# Patient Record
Sex: Male | Born: 1950 | Race: White | Hispanic: No | Marital: Married | Smoking: Never smoker
Health system: Southern US, Community
[De-identification: ages and names within clinical notes are randomized; demographics above are authoritative.]

## PROBLEM LIST (undated history)

## (undated) DIAGNOSIS — H269 Unspecified cataract: Secondary | ICD-10-CM

## (undated) DIAGNOSIS — M1990 Unspecified osteoarthritis, unspecified site: Secondary | ICD-10-CM

## (undated) DIAGNOSIS — K219 Gastro-esophageal reflux disease without esophagitis: Secondary | ICD-10-CM

## (undated) DIAGNOSIS — D869 Sarcoidosis, unspecified: Secondary | ICD-10-CM

## (undated) DIAGNOSIS — E785 Hyperlipidemia, unspecified: Secondary | ICD-10-CM

## (undated) DIAGNOSIS — T7840XA Allergy, unspecified, initial encounter: Secondary | ICD-10-CM

## (undated) HISTORY — DX: Unspecified osteoarthritis, unspecified site: M19.90

## (undated) HISTORY — PX: COLONOSCOPY: SHX174

## (undated) HISTORY — PX: POLYPECTOMY: SHX149

## (undated) HISTORY — PX: UPPER GASTROINTESTINAL ENDOSCOPY: SHX188

## (undated) HISTORY — PX: CHOLECYSTECTOMY: SHX55

## (undated) HISTORY — DX: Gastro-esophageal reflux disease without esophagitis: K21.9

## (undated) HISTORY — PX: APPENDECTOMY: SHX54

## (undated) HISTORY — DX: Hyperlipidemia, unspecified: E78.5

## (undated) HISTORY — DX: Sarcoidosis, unspecified: D86.9

## (undated) HISTORY — DX: Unspecified cataract: H26.9

## (undated) HISTORY — DX: Allergy, unspecified, initial encounter: T78.40XA

---

## 2010-02-08 ENCOUNTER — Encounter (INDEPENDENT_AMBULATORY_CARE_PROVIDER_SITE_OTHER): Payer: Self-pay | Admitting: *Deleted

## 2010-03-08 ENCOUNTER — Encounter (INDEPENDENT_AMBULATORY_CARE_PROVIDER_SITE_OTHER): Payer: Self-pay | Admitting: *Deleted

## 2010-03-11 ENCOUNTER — Ambulatory Visit: Payer: Self-pay | Admitting: Gastroenterology

## 2010-03-22 ENCOUNTER — Telehealth: Payer: Self-pay | Admitting: Gastroenterology

## 2010-04-17 ENCOUNTER — Ambulatory Visit: Payer: Self-pay | Admitting: Gastroenterology

## 2010-04-19 ENCOUNTER — Encounter: Payer: Self-pay | Admitting: Gastroenterology

## 2010-04-22 ENCOUNTER — Inpatient Hospital Stay (HOSPITAL_COMMUNITY): Admission: EM | Admit: 2010-04-22 | Discharge: 2010-05-02 | Payer: Self-pay | Admitting: Emergency Medicine

## 2010-04-24 ENCOUNTER — Encounter (INDEPENDENT_AMBULATORY_CARE_PROVIDER_SITE_OTHER): Payer: Self-pay | Admitting: General Surgery

## 2010-07-30 NOTE — Miscellaneous (Signed)
Summary: [facility] Previsit/prep  Clinical Lists Changes  Medications: Added new medication of MOVIPREP 100 GM  SOLR (PEG-KCL-NACL-NASULF-NA ASC-C) As per prep instructions. - Signed Rx of MOVIPREP 100 GM  SOLR (PEG-KCL-NACL-NASULF-NA ASC-C) As per prep instructions.;  #1 x 0;  Signed;  Entered by: Wyona Almas RN;  Authorized by: Mardella Layman MD [employer];  Method used: Electronically to [facility] [address] [place], [place], [place]  [address], Ph: [phone], Fax: [phone] Observations: Added new observation of NKA: T (03/11/2010 13:50)    Prescriptions: MOVIPREP 100 GM  SOLR (PEG-KCL-NACL-NASULF-NA ASC-C) As per prep instructions.  #1 x 0   Entered by:   Wyona Almas RN   Authorized by:   Mardella Layman MD [facility]   Signed by:   Wyona Almas RN on 03/11/2010   Method used:   Electronically to        [facility] (retail)       [address]       [place], [place]  [address]       Ph: [phone]       Fax: [phone]   RxID:   [id number]

## 2010-07-30 NOTE — Progress Notes (Signed)
Summary: cx fee?  Phone Note Call from Patient   Caller: Patient Call For: Dr. Jarold Motto Reason for Call: Talk to Doctor Summary of Call: pt called to cancel his COL scheduled for Monday... reason being his wife is having emergency surgery on Monday... pt rescheduled for next available (Oct 19th) Dr. Jarold Motto, do you wish to charge this pt a cx fee? Initial call taken by: Vallarie Mare,  March 22, 2010 9:11 AM  Follow-up for Phone Call         these calls are Adm. matters...we have protocols to follow..send these calls to [facility], Follow-up by: Mardella Layman MD FACG,  March 22, 2010 10:56 AM  Additional Follow-up for Phone Call Additional follow up Details #1::        Don't charge the late cancellation fee.  Additional Follow-up by: Clarnce Flock,  March 26, 2010 8:41 AM    Additional Follow-up for Phone Call Additional follow up Details #2::    Patient NOT BILLED. Follow-up by: Leanor Kail [facility],  March 27, 2010 11:56 AM

## 2010-07-30 NOTE — Letter (Signed)
Summary: Patient Notice- Polyp Results Pls delete-duplicate letter  [facility]  [address] [place], [place] [address]   Phone: [phone]  Fax: [phone]        April 19, 2010 MRN: [id number]    William [name] [address] [place], [place]  [address]    Dear Mr. Schueller,  I am pleased to inform you that the colon polyp(s) removed during your recent colonoscopy was (were) found to be benign (no cancer detected) upon pathologic examination.  I recommend you have a repeat colonoscopy examination in 5_ years to look for recurrent polyps, as having colon polyps increases your risk for having recurrent polyps or even colon cancer in the future.  Should you develop new or worsening symptoms of abdominal pain, bowel habit changes or bleeding from the rectum or bowels, please schedule an evaluation with either your primary care physician or with me.  Additional information/recommendations:  _xx_ No further action with gastroenterology is needed at this time. Please      follow-up with your primary care physician for your other healthcare      needs.  __ Please call [phone] to schedule a return visit to review your      situation.  __ Please keep your follow-up visit as already scheduled.  __ Continue treatment plan as outlined the day of your exam.  Please call us if you are having persistent problems or have questions about your condition that have not been fully answered at this time.  Sincerely,  Mardella Layman MD [facility]  This letter has been electronically signed by your physician.

## 2010-07-30 NOTE — Letter (Signed)
Summary: Patient Notice- Polyp Results  [facility]  [address] [place], [place] [address]   Phone: [phone]  Fax: [phone]        April 19, 2010 MRN: [id number]    William [name] [address] [place], [place]  [address]    Dear William [name],  I am pleased to inform you that the colon polyp(s) removed during your recent colonoscopy was (were) found to be benign (no cancer detected) upon pathologic examination.  I recommend you have a repeat colonoscopy examination in 5_ years to look for recurrent polyps, as having colon polyps increases your risk for having recurrent polyps or even colon cancer in the future.  Should you develop new or worsening symptoms of abdominal pain, bowel habit changes or bleeding from the rectum or bowels, please schedule an evaluation with either your primary care physician or with me.  Additional information/recommendations:  _x_ No further action with gastroenterology is needed at this time. Please      follow-up with your primary care physician for your other healthcare      needs.  __ Please call [phone] to schedule a return visit to review your      situation.  __ Please keep your follow-up visit as already scheduled.  __ Continue treatment plan as outlined the day of your exam.  Please call us if you are having persistent problems or have questions about your condition that have not been fully answered at this time.  Sincerely,  Mardella Layman MD [facility]  This letter has been electronically signed by your physician.  Appended Document: Patient Notice- Polyp Results letter mailed

## 2010-07-30 NOTE — Procedures (Signed)
Summary: Colonoscopy  Patient: William [name] Note: All result statuses are Final unless otherwise noted.  Tests: (1) Colonoscopy (COL)   COL Colonoscopy           DONE (C)     [facility]     [address]     [place], [place]  [address]           COLONOSCOPY PROCEDURE REPORT           PATIENT:  William [name], William [name]  MR#:  [id number]     BIRTHDATE:  [birth year], 59 yrs. old  GENDER:  male     ENDOSCOPIST:  Vania Rea. Jarold Motto, MD, [facility]     REF. BY:  Rudi Heap, M.D.     PROCEDURE DATE:  04/17/2010     PROCEDURE:  Colonoscopy with snare polypectomy     ASA CLASS:  Class II     INDICATIONS:  Routine Risk Screening     MEDICATIONS:   Fentanyl 50 mcg IV, Versed 6 mg IV           DESCRIPTION OF PROCEDURE:   After the risks benefits and     alternatives of the procedure were thoroughly explained, informed     consent was obtained.  Digital rectal exam was performed and     revealed no abnormalities.   The LB 180AL [id number] endoscope was     introduced through the anus and advanced to the cecum, which was     identified by both the appendix and ileocecal valve, without     limitations.  The quality of the prep was excellent, using     MoviPrep.  The instrument was then slowly withdrawn as the colon     was fully examined.     <<PROCEDUREIMAGES>>           FINDINGS:  ENDOSCOPIC FINDINGS:  This was otherwise a normal     examination of the colon. in the sigmoid colon. 1.5 CM STALKED     POLYP HOT SNARE EXCISED. A pedunculated polyp was found.     Retroflexed views in the rectum revealed no abnormalities.    The     scope was then withdrawn from the patient and the procedure     completed.           COMPLICATIONS:  None     ENDOSCOPIC IMPRESSION:     1) Otherwise normal examination     2) Descending colon polyp     RECOMMENDATIONS:     1) Repeat Colonoscopy in 5 years.     REPEAT EXAM:  No           ______________________________     Vania Rea. Jarold Motto, MD, [facility]        CC:           n.     REVISED:  04/17/2010 02:45 PM     eSIGNED:   Vania Rea. Patterson at 04/17/2010 02:45 PM           Darlina Guys, [id number]  Note: An exclamation mark (!) indicates a result that was not dispersed into the flowsheet. Document Creation Date: 04/17/2010 2:46 PM _______________________________________________________________________  (1) Order result status: Final Collection or observation date-time: 04/17/2010 14:30 Requested date-time:  Receipt date-time:  Reported date-time:  Referring Physician:   Ordering Physician: Sheryn Bison [phone]) Specimen Source:  Source: Launa Grill Order Number: [id number] Lab site:   Appended Document: Colonoscopy 5y f/u  Appended Document: Colonoscopy  Procedures Next Due Date:    Colonoscopy: 03/2015

## 2010-07-30 NOTE — Letter (Signed)
Summary: Previsit letter  [facility]  [address] [place], [place] [address]   Phone: [phone]  Fax: [phone]       02/08/2010 MRN: [id number]  William [name] [address] [place], [place]  [address]  Dear [name],  Welcome to the Gastroenterology Division at [facility].    You are scheduled to see a nurse for your pre-procedure visit on 03-11-10 at 2:00P.M. on the 3rd floor at [facility], [address]  We ask that you try to arrive at our office 15 minutes prior to your appointment time to allow for check-in.  Your nurse visit will consist of discussing your medical and surgical history, your immediate family medical history, and your medications.    Please bring a complete list of all your medications or, if you prefer, bring the medication bottles and we will list them.  We will need to be aware of both prescribed and over the counter drugs.  We will need to know exact dosage information as well.  If you are on blood thinners (Coumadin, Plavix, Aggrenox, Ticlid, etc.) please call our office today/prior to your appointment, as we need to consult with your physician about holding your medication.   Please be prepared to read and sign documents such as consent forms, a financial agreement, and acknowledgement forms.  If necessary, and with your consent, a friend or relative is welcome to sit-in on the nurse visit with you.  Please bring your insurance card so that we may make a copy of it.  If your insurance requires a referral to see a specialist, please bring your referral form from your primary care physician.  No co-pay is required for this nurse visit.     If you cannot keep your appointment, please call [phone] to cancel or reschedule prior to your appointment date.  This allows [place] the opportunity to schedule an appointment for another patient in need of care.    Thank you for choosing [facility] for your medical needs.   We appreciate the opportunity to care for you.  Please visit [place] at our website  to learn more about our practice.                     Sincerely.                                                                                                                   The Gastroenterology Division

## 2010-07-30 NOTE — Letter (Signed)
Summary: [facility] Instructions  [facility]  [address] [place], [place] [address]   Phone: [phone]  Fax: [phone]       ILYAS [name]    December 29, 1950    MRN: [id number]        Procedure Day Dorna Bloom: William [name]    03/25/2010     Arrival Time: 9:30AM     Procedure Time: 10:00AM     Location of Procedure:                    _X_  [facility] (4th Floor)                       PREPARATION FOR COLONOSCOPY WITH MOVIPREP   Starting 5 days prior to your procedure 03/20/2010 do not eat nuts, seeds, popcorn, corn, beans, peas,  salads, or any raw vegetables.  Do not take any fiber supplements (e.g. Metamucil, Citrucel, and Benefiber).  THE DAY BEFORE YOUR PROCEDURE         DATE: 03/24/10     DAY: SUNDAY  1.  Drink clear liquids the entire day-NO SOLID FOOD  2.  Do not drink anything colored red or purple.  Avoid juices with pulp.  No orange juice.  3.  Drink at least 64 oz. (8 glasses) of fluid/clear liquids during the day to prevent dehydration and help the prep work efficiently.  CLEAR LIQUIDS INCLUDE: Water Jello Ice Popsicles Tea (sugar ok, no milk/cream) Powdered fruit flavored drinks Coffee (sugar ok, no milk/cream) Gatorade Juice: apple, white grape, white cranberry  Lemonade Clear bullion, consomm, broth Carbonated beverages (any kind) Strained chicken noodle soup Hard Candy                             4.  In the morning, mix first dose of MoviPrep solution:    Empty 1 Pouch A and 1 Pouch B into the disposable container    Add lukewarm drinking water to the top line of the container. Mix to dissolve    Refrigerate (mixed solution should be used within 24 hrs)  5.  Begin drinking the prep at 5:00 p.m. The MoviPrep container is divided by 4 marks.   Every 15 minutes drink the solution down to the next mark (approximately 8 oz) until the full liter is complete.   6.  Follow completed prep with 16 oz of clear liquid of your choice  (Nothing red or purple).  Continue to drink clear liquids until bedtime.  7.  Before going to bed, mix second dose of MoviPrep solution:    Empty 1 Pouch A and 1 Pouch B into the disposable container    Add lukewarm drinking water to the top line of the container. Mix to dissolve    Refrigerate  THE DAY OF YOUR PROCEDURE      DATE: 03/25/10   DAY: MONDAY  Beginning at 5:30AM (5 hours before procedure):         1. Every 15 minutes, drink the solution down to the next mark (approx 8 oz) until the full liter is complete.  2. Follow completed prep with 16 oz. of clear liquid of your choice.    3. You may drink clear liquids until 8:30AM (2 HOURS BEFORE PROCEDURE).   MEDICATION INSTRUCTIONS  Unless otherwise instructed, you should take regular prescription medications with a small sip of water   as early as possible the  morning of your procedure.         OTHER INSTRUCTIONS  You will need a responsible adult at least 58 years of age to accompany you and drive you home.   This person must remain in the waiting room during your procedure.  Wear loose fitting clothing that is easily removed.  Leave jewelry and other valuables at home.  However, you may wish to bring a book to read or  an iPod/MP3 player to listen to music as you wait for your procedure to start.  Remove all body piercing jewelry and leave at home.  Total time from sign-in until discharge is approximately 2-3 hours.  You should go home directly after your procedure and rest.  You can resume normal activities the  day after your procedure.  The day of your procedure you should not:   Drive   Make legal decisions   Operate machinery   Drink alcohol   Return to work  You will receive specific instructions about eating, activities and medications before you leave.    The above instructions have been reviewed and explained to me by  Wyona Almas RN  March 11, 2010 2:18 PM     I fully  understand and can verbalize these instructions _____________________________ Date _________   Appended Document: Moviprep Instructions Procedure time is 10:30AM

## 2010-09-10 LAB — CBC
HCT: 37.6 % — ABNORMAL LOW (ref 39.0–52.0)
Hemoglobin: 12.9 g/dL — ABNORMAL LOW (ref 13.0–17.0)
MCHC: 34.1 g/dL (ref 30.0–36.0)
MCHC: 34.2 g/dL (ref 30.0–36.0)
MCHC: 34.2 g/dL (ref 30.0–36.0)
Platelets: 272 10*3/uL (ref 150–400)
RDW: 13.1 % (ref 11.5–15.5)
RDW: 13.8 % (ref 11.5–15.5)

## 2010-09-10 LAB — MAGNESIUM: Magnesium: 2 mg/dL (ref 1.5–2.5)

## 2010-09-10 LAB — COMPREHENSIVE METABOLIC PANEL
ALT: 47 U/L (ref 0–53)
ALT: 48 U/L (ref 0–53)
ALT: 75 U/L — ABNORMAL HIGH (ref 0–53)
AST: 31 U/L (ref 0–37)
AST: 69 U/L — ABNORMAL HIGH (ref 0–37)
Albumin: 2.3 g/dL — ABNORMAL LOW (ref 3.5–5.2)
Alkaline Phosphatase: 98 U/L (ref 39–117)
CO2: 24 mEq/L (ref 19–32)
Calcium: 7.6 mg/dL — ABNORMAL LOW (ref 8.4–10.5)
Calcium: 7.7 mg/dL — ABNORMAL LOW (ref 8.4–10.5)
Calcium: 8.1 mg/dL — ABNORMAL LOW (ref 8.4–10.5)
Creatinine, Ser: 0.8 mg/dL (ref 0.4–1.5)
GFR calc Af Amer: >60 mL/min (ref >60)
GFR calc Af Amer: >60 mL/min (ref >60)
GFR calc non Af Amer: >60 mL/min (ref >60)
Glucose, Bld: 91 mg/dL (ref 70–99)
Glucose, Bld: 93 mg/dL (ref 70–99)
Glucose, Bld: 97 mg/dL (ref 70–99)
Potassium: 3.6 mEq/L (ref 3.5–5.1)
Sodium: 137 mEq/L (ref 135–145)
Sodium: 140 mEq/L (ref 135–145)
Sodium: 141 mEq/L (ref 135–145)
Total Bilirubin: 1.3 mg/dL — ABNORMAL HIGH (ref 0.3–1.2)
Total Protein: 6 g/dL (ref 6.0–8.3)
Total Protein: 6.7 g/dL (ref 6.0–8.3)

## 2010-09-10 LAB — DIFFERENTIAL
Basophils Absolute: 0 10*3/uL (ref 0.0–0.1)
Basophils Relative: 0 % (ref 0–1)
Eosinophils Absolute: 0.1 10*3/uL (ref 0.0–0.7)
Eosinophils Absolute: 0.1 10*3/uL (ref 0.0–0.7)
Eosinophils Absolute: 0.1 10*3/uL (ref 0.0–0.7)
Lymphocytes Relative: 4 % — ABNORMAL LOW (ref 12–46)
Lymphs Abs: 0.3 10*3/uL — ABNORMAL LOW (ref 0.7–4.0)
Lymphs Abs: 0.5 10*3/uL — ABNORMAL LOW (ref 0.7–4.0)
Monocytes Relative: 6 % (ref 3–12)
Monocytes Relative: 7 % (ref 3–12)
Neutrophils Relative %: 89 % — ABNORMAL HIGH (ref 43–77)
Neutrophils Relative %: 89 % — ABNORMAL HIGH (ref 43–77)
Neutrophils Relative %: 89 % — ABNORMAL HIGH (ref 43–77)

## 2010-09-10 LAB — PHOSPHORUS
Phosphorus: 2.9 mg/dL (ref 2.3–4.6)
Phosphorus: 3.1 mg/dL (ref 2.3–4.6)
Phosphorus: 3.3 mg/dL (ref 2.3–4.6)

## 2010-09-11 LAB — BASIC METABOLIC PANEL
BUN: 14 mg/dL (ref 6–23)
BUN: 16 mg/dL (ref 6–23)
BUN: 18 mg/dL (ref 6–23)
BUN: 26 mg/dL — ABNORMAL HIGH (ref 6–23)
CO2: 28 mEq/L (ref 19–32)
Chloride: 100 mEq/L (ref 96–112)
Chloride: 99 mEq/L (ref 96–112)
Creatinine, Ser: 0.91 mg/dL (ref 0.4–1.5)
Creatinine, Ser: 0.94 mg/dL (ref 0.4–1.5)
GFR calc Af Amer: >60 mL/min (ref >60)
GFR calc non Af Amer: >60 mL/min (ref >60)
GFR calc non Af Amer: >60 mL/min (ref >60)
Glucose, Bld: 110 mg/dL — ABNORMAL HIGH (ref 70–99)
Glucose, Bld: 111 mg/dL — ABNORMAL HIGH (ref 70–99)
Potassium: 3.8 mEq/L (ref 3.5–5.1)
Potassium: 3.9 mEq/L (ref 3.5–5.1)
Potassium: 4.1 mEq/L (ref 3.5–5.1)
Sodium: 134 mEq/L — ABNORMAL LOW (ref 135–145)
Sodium: 136 mEq/L (ref 135–145)

## 2010-09-11 LAB — DIFFERENTIAL
Basophils Absolute: 0 10*3/uL (ref 0.0–0.1)
Basophils Absolute: 0 10*3/uL (ref 0.0–0.1)
Basophils Absolute: 0 10*3/uL (ref 0.0–0.1)
Basophils Relative: 0 % (ref 0–1)
Basophils Relative: 0 % (ref 0–1)
Basophils Relative: 0 % (ref 0–1)
Basophils Relative: 0 % (ref 0–1)
Basophils Relative: 0 % (ref 0–1)
Eosinophils Absolute: 0 10*3/uL (ref 0.0–0.7)
Eosinophils Absolute: 0 10*3/uL (ref 0.0–0.7)
Eosinophils Absolute: 0 10*3/uL (ref 0.0–0.7)
Eosinophils Absolute: 0.1 10*3/uL (ref 0.0–0.7)
Eosinophils Relative: 0 % (ref 0–5)
Eosinophils Relative: 0 % (ref 0–5)
Eosinophils Relative: 0 % (ref 0–5)
Lymphocytes Relative: 4 % — ABNORMAL LOW (ref 12–46)
Lymphocytes Relative: 4 % — ABNORMAL LOW (ref 12–46)
Lymphs Abs: 0.3 10*3/uL — ABNORMAL LOW (ref 0.7–4.0)
Lymphs Abs: 0.3 10*3/uL — ABNORMAL LOW (ref 0.7–4.0)
Lymphs Abs: 0.4 10*3/uL — ABNORMAL LOW (ref 0.7–4.0)
Lymphs Abs: 0.4 10*3/uL — ABNORMAL LOW (ref 0.7–4.0)
Monocytes Absolute: 0.5 10*3/uL (ref 0.1–1.0)
Monocytes Absolute: 0.6 10*3/uL (ref 0.1–1.0)
Monocytes Absolute: 0.7 10*3/uL (ref 0.1–1.0)
Monocytes Absolute: 0.7 10*3/uL (ref 0.1–1.0)
Monocytes Relative: 3 % (ref 3–12)
Monocytes Relative: 4 % (ref 3–12)
Monocytes Relative: 5 % (ref 3–12)
Neutro Abs: 13.6 10*3/uL — ABNORMAL HIGH (ref 1.7–7.7)
Neutro Abs: 16.6 10*3/uL — ABNORMAL HIGH (ref 1.7–7.7)
Neutro Abs: 6.5 10*3/uL (ref 1.7–7.7)
Neutro Abs: 6.5 10*3/uL (ref 1.7–7.7)
Neutro Abs: 9 10*3/uL — ABNORMAL HIGH (ref 1.7–7.7)
Neutro Abs: 9.9 10*3/uL — ABNORMAL HIGH (ref 1.7–7.7)
Neutrophils Relative %: 85 % — ABNORMAL HIGH (ref 43–77)
Neutrophils Relative %: 86 % — ABNORMAL HIGH (ref 43–77)
Neutrophils Relative %: 90 % — ABNORMAL HIGH (ref 43–77)
Neutrophils Relative %: 93 % — ABNORMAL HIGH (ref 43–77)
Neutrophils Relative %: 93 % — ABNORMAL HIGH (ref 43–77)

## 2010-09-11 LAB — COMPREHENSIVE METABOLIC PANEL
ALT: 53 U/L (ref 0–53)
ALT: 62 U/L — ABNORMAL HIGH (ref 0–53)
AST: 62 U/L — ABNORMAL HIGH (ref 0–37)
Albumin: 2.2 g/dL — ABNORMAL LOW (ref 3.5–5.2)
Albumin: 2.4 g/dL — ABNORMAL LOW (ref 3.5–5.2)
Albumin: 3.3 g/dL — ABNORMAL LOW (ref 3.5–5.2)
Alkaline Phosphatase: 63 U/L (ref 39–117)
Alkaline Phosphatase: 78 U/L (ref 39–117)
Alkaline Phosphatase: 81 U/L (ref 39–117)
Alkaline Phosphatase: 97 U/L (ref 39–117)
BUN: 20 mg/dL (ref 6–23)
BUN: 20 mg/dL (ref 6–23)
BUN: 22 mg/dL (ref 6–23)
CO2: 27 mEq/L (ref 19–32)
CO2: 29 mEq/L (ref 19–32)
Calcium: 7.7 mg/dL — ABNORMAL LOW (ref 8.4–10.5)
Calcium: 7.8 mg/dL — ABNORMAL LOW (ref 8.4–10.5)
Calcium: 8.3 mg/dL — ABNORMAL LOW (ref 8.4–10.5)
Chloride: 99 mEq/L (ref 96–112)
Creatinine, Ser: 0.86 mg/dL (ref 0.4–1.5)
GFR calc non Af Amer: >60 mL/min (ref >60)
Glucose, Bld: 108 mg/dL — ABNORMAL HIGH (ref 70–99)
Glucose, Bld: 125 mg/dL — ABNORMAL HIGH (ref 70–99)
Glucose, Bld: 126 mg/dL — ABNORMAL HIGH (ref 70–99)
Potassium: 3.7 mEq/L (ref 3.5–5.1)
Potassium: 4.3 mEq/L (ref 3.5–5.1)
Sodium: 135 mEq/L (ref 135–145)
Sodium: 140 mEq/L (ref 135–145)
Total Protein: 5.5 g/dL — ABNORMAL LOW (ref 6.0–8.3)
Total Protein: 5.9 g/dL — ABNORMAL LOW (ref 6.0–8.3)
Total Protein: 7 g/dL (ref 6.0–8.3)

## 2010-09-11 LAB — CBC
HCT: 35.1 % — ABNORMAL LOW (ref 39.0–52.0)
HCT: 37.1 % — ABNORMAL LOW (ref 39.0–52.0)
HCT: 39.3 % (ref 39.0–52.0)
HCT: 44.1 % (ref 39.0–52.0)
Hemoglobin: 12 g/dL — ABNORMAL LOW (ref 13.0–17.0)
Hemoglobin: 13.4 g/dL (ref 13.0–17.0)
Hemoglobin: 13.4 g/dL (ref 13.0–17.0)
Hemoglobin: 15.1 g/dL (ref 13.0–17.0)
MCH: 30.6 pg (ref 26.0–34.0)
MCH: 30.6 pg (ref 26.0–34.0)
MCH: 30.8 pg (ref 26.0–34.0)
MCHC: 34.2 g/dL (ref 30.0–36.0)
MCHC: 34.2 g/dL (ref 30.0–36.0)
MCHC: 34.4 g/dL (ref 30.0–36.0)
MCHC: 34.9 g/dL (ref 30.0–36.0)
MCV: 88.9 fL (ref 78.0–100.0)
MCV: 89.6 fL (ref 78.0–100.0)
MCV: 89.7 fL (ref 78.0–100.0)
MCV: 90.1 fL (ref 78.0–100.0)
MCV: 90.4 fL (ref 78.0–100.0)
MCV: 90.8 fL (ref 78.0–100.0)
Platelets: 141 10*3/uL — ABNORMAL LOW (ref 150–400)
Platelets: 145 10*3/uL — ABNORMAL LOW (ref 150–400)
Platelets: 205 10*3/uL (ref 150–400)
Platelets: 210 10*3/uL (ref 150–400)
RBC: 4.3 MIL/uL (ref 4.22–5.81)
RBC: 4.37 MIL/uL (ref 4.22–5.81)
RBC: 4.71 MIL/uL (ref 4.22–5.81)
RBC: 4.96 MIL/uL (ref 4.22–5.81)
RDW: 12.9 % (ref 11.5–15.5)
RDW: 13.3 % (ref 11.5–15.5)
RDW: 13.5 % (ref 11.5–15.5)
RDW: 13.6 % (ref 11.5–15.5)
RDW: 13.7 % (ref 11.5–15.5)
WBC: 10 10*3/uL (ref 4.0–10.5)
WBC: 17.8 10*3/uL — ABNORMAL HIGH (ref 4.0–10.5)
WBC: 17.9 10*3/uL — ABNORMAL HIGH (ref 4.0–10.5)
WBC: 7.6 10*3/uL (ref 4.0–10.5)

## 2010-09-11 LAB — HEPATIC FUNCTION PANEL
ALT: 17 U/L (ref 0–53)
AST: 29 U/L (ref 0–37)
Albumin: 3.9 g/dL (ref 3.5–5.2)
Alkaline Phosphatase: 76 U/L (ref 39–117)
Total Protein: 7.8 g/dL (ref 6.0–8.3)

## 2010-09-11 LAB — AMYLASE: Amylase: 38 U/L (ref 0–105)

## 2010-09-11 LAB — URINALYSIS, ROUTINE W REFLEX MICROSCOPIC
Bilirubin Urine: NEGATIVE
Glucose, UA: NEGATIVE mg/dL
Hgb urine dipstick: NEGATIVE
Specific Gravity, Urine: >1.03 — ABNORMAL HIGH (ref 1.005–1.030)
pH: 6 (ref 5.0–8.0)

## 2010-09-11 LAB — MAGNESIUM
Magnesium: 2.1 mg/dL (ref 1.5–2.5)
Magnesium: 2.2 mg/dL (ref 1.5–2.5)
Magnesium: 2.2 mg/dL (ref 1.5–2.5)

## 2010-09-11 LAB — PHOSPHORUS
Phosphorus: 2.5 mg/dL (ref 2.3–4.6)
Phosphorus: 2.9 mg/dL (ref 2.3–4.6)
Phosphorus: 3.1 mg/dL (ref 2.3–4.6)
Phosphorus: 4.3 mg/dL (ref 2.3–4.6)

## 2011-09-12 ENCOUNTER — Ambulatory Visit (INDEPENDENT_AMBULATORY_CARE_PROVIDER_SITE_OTHER): Payer: BC Managed Care – PPO | Admitting: Urology

## 2011-09-12 DIAGNOSIS — N138 Other obstructive and reflux uropathy: Secondary | ICD-10-CM

## 2011-09-12 DIAGNOSIS — N401 Enlarged prostate with lower urinary tract symptoms: Secondary | ICD-10-CM

## 2011-09-12 DIAGNOSIS — R972 Elevated prostate specific antigen [PSA]: Secondary | ICD-10-CM

## 2012-09-24 ENCOUNTER — Ambulatory Visit (INDEPENDENT_AMBULATORY_CARE_PROVIDER_SITE_OTHER): Payer: BC Managed Care – PPO | Admitting: Urology

## 2012-09-24 DIAGNOSIS — N138 Other obstructive and reflux uropathy: Secondary | ICD-10-CM

## 2012-09-24 DIAGNOSIS — N529 Male erectile dysfunction, unspecified: Secondary | ICD-10-CM

## 2012-09-24 DIAGNOSIS — N401 Enlarged prostate with lower urinary tract symptoms: Secondary | ICD-10-CM

## 2012-09-24 DIAGNOSIS — R972 Elevated prostate specific antigen [PSA]: Secondary | ICD-10-CM

## 2012-12-10 ENCOUNTER — Other Ambulatory Visit: Payer: Self-pay | Admitting: Urology

## 2012-12-10 DIAGNOSIS — R972 Elevated prostate specific antigen [PSA]: Secondary | ICD-10-CM

## 2012-12-20 ENCOUNTER — Ambulatory Visit (HOSPITAL_COMMUNITY)
Admission: RE | Admit: 2012-12-20 | Discharge: 2012-12-20 | Disposition: A | Discharge status: Home / Self Care | Payer: BC Managed Care – PPO | Source: Ambulatory Visit | Attending: Urology | Admitting: Urology

## 2012-12-20 DIAGNOSIS — R972 Elevated prostate specific antigen [PSA]: Secondary | ICD-10-CM

## 2013-01-20 ENCOUNTER — Ambulatory Visit (HOSPITAL_COMMUNITY)
Admission: RE | Admit: 2013-01-20 | Discharge: 2013-01-20 | Disposition: A | Discharge status: Home / Self Care | Payer: BC Managed Care – PPO | Source: Ambulatory Visit | Attending: Urology | Admitting: Urology

## 2013-01-20 DIAGNOSIS — N400 Enlarged prostate without lower urinary tract symptoms: Secondary | ICD-10-CM | POA: Insufficient documentation

## 2013-01-20 DIAGNOSIS — N429 Disorder of prostate, unspecified: Secondary | ICD-10-CM | POA: Insufficient documentation

## 2013-01-20 DIAGNOSIS — R972 Elevated prostate specific antigen [PSA]: Secondary | ICD-10-CM | POA: Insufficient documentation

## 2013-01-20 MED ORDER — GADOBENATE DIMEGLUMINE 529 MG/ML IV SOLN
20.0000 mL | Freq: Once | INTRAVENOUS | Status: AC | PRN
  Administered 2013-01-20: 16 mL via INTRAVENOUS

## 2013-07-26 ENCOUNTER — Ambulatory Visit (INDEPENDENT_AMBULATORY_CARE_PROVIDER_SITE_OTHER): Payer: BC Managed Care – PPO

## 2013-07-26 ENCOUNTER — Ambulatory Visit (INDEPENDENT_AMBULATORY_CARE_PROVIDER_SITE_OTHER): Payer: BC Managed Care – PPO | Admitting: General Practice

## 2013-07-26 ENCOUNTER — Encounter: Payer: Self-pay | Admitting: General Practice

## 2013-07-26 VITALS — BP 114/73 | HR 62 | Temp 97.7°F | Ht 69.0 in | Wt 177.0 lb

## 2013-07-26 DIAGNOSIS — J069 Acute upper respiratory infection, unspecified: Secondary | ICD-10-CM

## 2013-07-26 DIAGNOSIS — R05 Cough: Secondary | ICD-10-CM

## 2013-07-26 DIAGNOSIS — R059 Cough, unspecified: Secondary | ICD-10-CM

## 2013-07-26 MED ORDER — BENZONATATE 100 MG PO CAPS: 100.0000 mg | ORAL_CAPSULE | Freq: Three times a day (TID) | ORAL | Status: DC | PRN

## 2013-07-26 MED ORDER — AZITHROMYCIN 250 MG PO TABS: ORAL_TABLET | ORAL | Status: DC

## 2013-07-26 NOTE — Patient Instructions (Signed)

## 2013-07-26 NOTE — Progress Notes (Signed)
   Subjective:    Patient ID: William [name], male    DOB: 03-Aug-1950, 63 y.o.   MRN: [id number]  Cough This is a new problem. The current episode started in the past 7 days. The problem has been unchanged. The cough is productive of sputum. Associated symptoms include postnasal drip. Pertinent negatives include no chest pain, chills, fever, headaches, myalgias, nasal congestion, sore throat, shortness of breath or wheezing. The symptoms are aggravated by lying down. He has tried OTC cough suppressant for the symptoms. The treatment provided mild relief. His past medical history is significant for bronchitis. There is no history of asthma or pneumonia.      Review of Systems  Constitutional: Negative for fever and chills.  HENT: Positive for postnasal drip. Negative for sore throat.   Respiratory: Positive for cough and chest tightness. Negative for shortness of breath and wheezing.   Cardiovascular: Negative for chest pain and palpitations.  Musculoskeletal: Negative for myalgias.  Neurological: Negative for dizziness, weakness and headaches.       Objective:   Physical Exam  Constitutional: He is oriented to person, place, and time. He appears well-developed and well-nourished.  HENT:  Head: Normocephalic and atraumatic.  Right Ear: External ear normal.  Left Ear: External ear normal.  Mouth/Throat: Oropharynx is clear and moist.  Cardiovascular: Normal rate, regular rhythm and normal heart sounds.   Pulmonary/Chest: Effort normal and breath sounds normal. No respiratory distress. He exhibits no tenderness.  Neurological: He is alert and oriented to person, place, and time.  Skin: Skin is warm and dry.     [facility] reading (PRIMARY) by Erby Pian, FNP-C, acute changes noted.        Assessment & Plan:  1. Cough  - DG Chest 2 View; Future - benzonatate (TESSALON) 100 MG capsule; Take 1 capsule (100 mg total) by mouth 3 (three) times daily as needed.  Dispense: 30  capsule; Refill: 0 - DG Chest 2 View; Future - CBC With differential/Platelet  2. Upper respiratory infection  - azithromycin (ZITHROMAX) 250 MG tablet; Take as directed  Dispense: 6 tablet; Refill: 0 - CBC With differential/Platelet -RTO if symptoms worsen, may seek emergency medical treatment -follow up with chest xray in 1 month Patient verbalized understanding Erby Pian, FNP-C

## 2013-07-28 LAB — CBC WITH DIFFERENTIAL
BASOS: 0 %
Basophils Absolute: 0 10*3/uL (ref 0.0–0.2)
EOS: 2 %
Eosinophils Absolute: 0.1 10*3/uL (ref 0.0–0.4)
HCT: 44 % (ref 37.5–51.0)
HEMOGLOBIN: 15 g/dL (ref 12.6–17.7)
IMMATURE GRANS (ABS): 0 10*3/uL (ref 0.0–0.1)
IMMATURE GRANULOCYTES: 0 %
LYMPHS: 12 %
Lymphocytes Absolute: 0.5 10*3/uL — ABNORMAL LOW (ref 0.7–3.1)
MCH: 30.6 pg (ref 26.6–33.0)
MCHC: 34.1 g/dL (ref 31.5–35.7)
MCV: 90 fL (ref 79–97)
MONOCYTES: 12 %
Monocytes Absolute: 0.5 10*3/uL (ref 0.1–0.9)
NEUTROS PCT: 74 %
Neutrophils Absolute: 2.9 10*3/uL (ref 1.4–7.0)
PLATELETS: 229 10*3/uL (ref 150–379)
RBC: 4.9 x10E6/uL (ref 4.14–5.80)
RDW: 13.5 % (ref 12.3–15.4)
WBC: 3.9 10*3/uL (ref 3.4–10.8)

## 2013-08-11 ENCOUNTER — Telehealth: Payer: Self-pay | Admitting: General Practice

## 2013-08-11 NOTE — Telephone Encounter (Signed)
Spoke with wife. Appt moved to tomorrow.

## 2013-08-12 ENCOUNTER — Ambulatory Visit: Payer: BC Managed Care – PPO

## 2013-08-12 ENCOUNTER — Ambulatory Visit (INDEPENDENT_AMBULATORY_CARE_PROVIDER_SITE_OTHER): Payer: BC Managed Care – PPO | Admitting: General Practice

## 2013-08-12 ENCOUNTER — Encounter: Payer: Self-pay | Admitting: General Practice

## 2013-08-12 VITALS — BP 111/66 | HR 53 | Temp 96.8°F | Ht 69.0 in | Wt 178.5 lb

## 2013-08-12 DIAGNOSIS — R05 Cough: Secondary | ICD-10-CM

## 2013-08-12 DIAGNOSIS — R059 Cough, unspecified: Secondary | ICD-10-CM

## 2013-08-12 NOTE — Patient Instructions (Signed)
Allergic Rhinitis Allergic rhinitis is when the mucous membranes in the nose respond to allergens. Allergens are particles in the air that cause your body to have an allergic reaction. This causes you to release allergic antibodies. Through a chain of events, these eventually cause you to release histamine into the blood stream. Although meant to protect the body, it is this release of histamine that causes your discomfort, such as frequent sneezing, congestion, and an itchy, runny nose.  CAUSES  Seasonal allergic rhinitis (hay fever) is caused by pollen allergens that may come from grasses, trees, and weeds. Year-round allergic rhinitis (perennial allergic rhinitis) is caused by allergens such as house dust mites, pet dander, and mold spores.  SYMPTOMS   Nasal stuffiness (congestion).  Itchy, runny nose with sneezing and tearing of the eyes. DIAGNOSIS  Your health care provider can help you determine the allergen or allergens that trigger your symptoms. If you and your health care provider are unable to determine the allergen, skin or blood testing may be used. TREATMENT  Allergic Rhinitis does not have a cure, but it can be controlled by:  Medicines and allergy shots (immunotherapy).  Avoiding the allergen. Hay fever may often be treated with antihistamines in pill or nasal spray forms. Antihistamines block the effects of histamine. There are over-the-counter medicines that may help with nasal congestion and swelling around the eyes. Check with your health care provider before taking or giving this medicine.  If avoiding the allergen or the medicine prescribed do not work, there are many new medicines your health care provider can prescribe. Stronger medicine may be used if initial measures are ineffective. Desensitizing injections can be used if medicine and avoidance does not work. Desensitization is when a patient is given ongoing shots until the body becomes less sensitive to the allergen.  Make sure you follow up with your health care provider if problems continue. HOME CARE INSTRUCTIONS It is not possible to completely avoid allergens, but you can reduce your symptoms by taking steps to limit your exposure to them. It helps to know exactly what you are allergic to so that you can avoid your specific triggers. SEEK MEDICAL CARE IF:   You have a fever.  You develop a cough that does not stop easily (persistent).  You have shortness of breath.  You start wheezing.  Symptoms interfere with normal daily activities. Document Released: 03/11/2001 Document Revised: 04/06/2013 Document Reviewed: 02/21/2013 [employer] Patient Information 2014 [employer].  

## 2013-08-12 NOTE — Progress Notes (Signed)
   Subjective:    Patient ID: William [name], male    DOB: Oct 27, 1950, 63 y.o.   MRN: [id number]  HPI Patient presents today for follow up of cough. He was seen on 07/26/13 and reports improvement.     Review of Systems  Constitutional: Negative for fever and chills.  Respiratory: Negative for chest tightness and shortness of breath.   Cardiovascular: Negative for chest pain and palpitations.       Objective:   Physical Exam  Constitutional: He is oriented to person, place, and time. He appears well-developed and well-nourished.  Cardiovascular: Normal rate, regular rhythm and normal heart sounds.   Pulmonary/Chest: Effort normal and breath sounds normal. No respiratory distress. He exhibits no tenderness.  Neurological: He is alert and oriented to person, place, and time.        Assessment & Plan:  1. Cough - DG Chest 2 View; Future -RTO if symptoms worsen F/u chest xray in 2 weeks Patient verbalized understanding Erby Pian, FNP-C

## 2013-08-23 ENCOUNTER — Telehealth: Payer: Self-pay | Admitting: General Practice

## 2013-08-23 ENCOUNTER — Ambulatory Visit: Payer: BC Managed Care – PPO | Admitting: General Practice

## 2013-08-23 NOTE — Telephone Encounter (Signed)
APPT MADE

## 2013-08-24 ENCOUNTER — Ambulatory Visit (INDEPENDENT_AMBULATORY_CARE_PROVIDER_SITE_OTHER): Payer: BC Managed Care – PPO | Admitting: General Practice

## 2013-08-24 ENCOUNTER — Encounter: Payer: Self-pay | Admitting: General Practice

## 2013-08-24 ENCOUNTER — Ambulatory Visit (INDEPENDENT_AMBULATORY_CARE_PROVIDER_SITE_OTHER): Payer: BC Managed Care – PPO

## 2013-08-24 VITALS — BP 106/56 | HR 63 | Temp 97.0°F | Ht 69.0 in | Wt 177.0 lb

## 2013-08-24 DIAGNOSIS — R9389 Abnormal findings on diagnostic imaging of other specified body structures: Secondary | ICD-10-CM

## 2013-08-24 DIAGNOSIS — R059 Cough, unspecified: Secondary | ICD-10-CM

## 2013-08-24 DIAGNOSIS — R918 Other nonspecific abnormal finding of lung field: Secondary | ICD-10-CM

## 2013-08-24 DIAGNOSIS — J988 Other specified respiratory disorders: Secondary | ICD-10-CM

## 2013-08-24 DIAGNOSIS — R05 Cough: Secondary | ICD-10-CM

## 2013-08-24 MED ORDER — LEVOFLOXACIN 750 MG PO TABS: 750.0000 mg | ORAL_TABLET | Freq: Every day | ORAL | Status: DC

## 2013-08-24 NOTE — Patient Instructions (Signed)

## 2013-08-24 NOTE — Progress Notes (Signed)
Subjective:    Patient ID: William [name], male    DOB: [birth year], 63 y.o.   MRN: [id number]  Cough This is a recurrent problem. The current episode started 1 to 4 weeks ago. The problem has been unchanged. The cough is non-productive. Pertinent negatives include no chest pain, chills, fever, nasal congestion, postnasal drip, shortness of breath or wheezing. The symptoms are aggravated by cold air. He has tried OTC cough suppressant for the symptoms. The treatment provided moderate relief. There is no history of asthma, bronchitis or pneumonia.      Review of Systems  Constitutional: Negative for fever and chills.  HENT: Negative for postnasal drip.   Respiratory: Positive for cough. Negative for chest tightness, shortness of breath and wheezing.   Cardiovascular: Negative for chest pain and palpitations.  All other systems reviewed and are negative.       Objective:   Physical Exam  Constitutional: He is oriented to person, place, and time. He appears well-developed and well-nourished.  Cardiovascular: Normal rate, regular rhythm and normal heart sounds.   Pulmonary/Chest: Effort normal and breath sounds normal. No respiratory distress. He exhibits no tenderness.  Neurological: He is alert and oriented to person, place, and time.  Skin: Skin is warm and dry.  Psychiatric: He has a normal mood and affect.    [facility] reading (PRIMARY) by Erby Pian, FNP-C, no improvement from previous.       Assessment & Plan:  1. Cough  - DG Chest 2 View; Future  2. Respiratory infection  - levofloxacin (LEVAQUIN) 750 MG tablet; Take 1 tablet (750 mg total) by mouth daily.  Dispense: 7 tablet; Refill: 0 - CT Chest Wo Contrast; Future (Patient denies being able to have ct performed before Wednesday, Mar. 4, 2015) - CMP14+EGFR  3. Abnormal chest x-ray -maintain appointment for CT scan once scheduled -may seek emergency medical treatment -Patient verbalized understanding Erby Pian, FNP-C

## 2013-08-25 LAB — CMP14+EGFR
A/G RATIO: 1.4 (ref 1.1–2.5)
ALT: 14 IU/L (ref 0–44)
AST: 16 IU/L (ref 0–40)
Albumin: 4.3 g/dL (ref 3.6–4.8)
Alkaline Phosphatase: 74 IU/L (ref 39–117)
BUN/Creatinine Ratio: 22 (ref 10–22)
BUN: 20 mg/dL (ref 8–27)
CALCIUM: 9.4 mg/dL (ref 8.6–10.2)
CO2: 24 mmol/L (ref 18–29)
CREATININE: 0.92 mg/dL (ref 0.76–1.27)
Chloride: 103 mmol/L (ref 97–108)
GFR calc Af Amer: 103 mL/min/{1.73_m2} (ref >59)
GFR, EST NON AFRICAN AMERICAN: 89 mL/min/{1.73_m2} (ref >59)
GLOBULIN, TOTAL: 3 g/dL (ref 1.5–4.5)
GLUCOSE: 96 mg/dL (ref 65–99)
Potassium: 4.5 mmol/L (ref 3.5–5.2)
Sodium: 141 mmol/L (ref 134–144)
TOTAL PROTEIN: 7.3 g/dL (ref 6.0–8.5)
Total Bilirubin: 0.3 mg/dL (ref 0.0–1.2)

## 2013-08-26 ENCOUNTER — Other Ambulatory Visit: Payer: Self-pay | Admitting: Nurse Practitioner

## 2013-08-26 DIAGNOSIS — R9389 Abnormal findings on diagnostic imaging of other specified body structures: Secondary | ICD-10-CM

## 2013-08-28 DIAGNOSIS — D869 Sarcoidosis, unspecified: Secondary | ICD-10-CM

## 2013-08-28 HISTORY — DX: Sarcoidosis, unspecified: D86.9

## 2013-08-31 ENCOUNTER — Other Ambulatory Visit: Payer: Self-pay | Admitting: Nurse Practitioner

## 2013-08-31 ENCOUNTER — Ambulatory Visit (HOSPITAL_COMMUNITY)
Admission: RE | Admit: 2013-08-31 | Discharge: 2013-08-31 | Disposition: A | Discharge status: Home / Self Care | Payer: BC Managed Care – PPO | Source: Ambulatory Visit | Attending: Nurse Practitioner | Admitting: Nurse Practitioner

## 2013-08-31 DIAGNOSIS — R05 Cough: Secondary | ICD-10-CM | POA: Insufficient documentation

## 2013-08-31 DIAGNOSIS — R9389 Abnormal findings on diagnostic imaging of other specified body structures: Secondary | ICD-10-CM

## 2013-08-31 DIAGNOSIS — R059 Cough, unspecified: Secondary | ICD-10-CM | POA: Insufficient documentation

## 2013-09-01 ENCOUNTER — Telehealth: Payer: Self-pay | Admitting: General Practice

## 2013-09-02 ENCOUNTER — Other Ambulatory Visit: Payer: Self-pay | Admitting: General Practice

## 2013-09-02 DIAGNOSIS — R9389 Abnormal findings on diagnostic imaging of other specified body structures: Secondary | ICD-10-CM

## 2013-09-02 NOTE — Telephone Encounter (Signed)
Spoke with patient and informed of CT results. Discussed pulmonology referral and patient in agreement. Pulmonology referral made.

## 2013-09-02 NOTE — Telephone Encounter (Signed)
Please advise 

## 2013-09-05 ENCOUNTER — Ambulatory Visit (INDEPENDENT_AMBULATORY_CARE_PROVIDER_SITE_OTHER): Payer: BC Managed Care – PPO | Admitting: Pulmonary Disease

## 2013-09-05 ENCOUNTER — Encounter: Payer: Self-pay | Admitting: Pulmonary Disease

## 2013-09-05 VITALS — BP 108/70 | HR 51 | Temp 97.5°F | Ht 69.0 in | Wt 177.6 lb

## 2013-09-05 DIAGNOSIS — D860 Sarcoidosis of lung: Secondary | ICD-10-CM | POA: Insufficient documentation

## 2013-09-05 DIAGNOSIS — R9389 Abnormal findings on diagnostic imaging of other specified body structures: Secondary | ICD-10-CM

## 2013-09-05 NOTE — Assessment & Plan Note (Signed)
The patient has a significantly abnormal CT chest, which is suggestive of either sarcoidosis or hypersensitivity pneumonitis. His primary symptom on presentation has been cough, and surprisingly not shortness of breath. He is going to need bronchoscopy with biopsy for diagnosis. If this is more consistent with hypersensitivity pneumonitis, he will need lab work done, and also an evaluation of his workplace. The patient is agreeable to this approach, and is willing to proceed with bronchoscopy. He understands the risk of respiratory distress, hemoptysis, as well as pneumothorax.

## 2013-09-05 NOTE — Patient Instructions (Signed)
Will schedule for bronchoscopy next week to get a diagnosis for your abnormal xray. Avoid aspirin until after your test.

## 2013-09-05 NOTE — Progress Notes (Signed)
   Subjective:    Patient ID: William [name], male    DOB: 10/28/1950, 63 y.o.   MRN: [id number]  HPI The patient is a 63-year-old male who I've been asked to see for an abnormal CT chest and ongoing cough. His cough started in January of this year along with audible wheezing, but he does not feel it is been progressive in nature. He is been treated with antibiotics and cough suppressants with some improvement, but continues to be an ongoing problem. He has had a recent CT chest that showed significant micronodule air he primarily in the upper lobes, no lymphadenopathy, and a left upper lobe density that may have been present in the past. The patient denies any shortness of breath, and exercises on irregular basis. He is eating well, and has not had any weight loss. He does work in the textile industry, but is not exposed to any particulate matter of significance.  He denies any exposures to particulate matter through his life, including silica. He has never participated in sandblasting of any type, has no unusual hobbies, and has no unusual pets or birds. He denies any exposures to birds, nor has he participated in renovation work.  He does note there is significant mold on the walls at his workplace, and they often have to washdown with bleach in order to clear the area. However, it comes right back. He has no issues with mold in his home.   Review of Systems  Constitutional: Negative for fever and unexpected weight change.  HENT: Negative for congestion, dental problem, ear pain, nosebleeds, postnasal drip, rhinorrhea, sinus pressure, sneezing, sore throat and trouble swallowing.   Eyes: Negative for redness and itching.  Respiratory: Positive for cough. Negative for chest tightness, shortness of breath and wheezing.   Cardiovascular: Positive for chest pain. Negative for palpitations and leg swelling.  Gastrointestinal: Negative for nausea and vomiting.  Genitourinary: Negative for dysuria.   Musculoskeletal: Negative for joint swelling.  Skin: Negative for rash.  Neurological: Negative for headaches.  Hematological: Does not bruise/bleed easily.  Psychiatric/Behavioral: Negative for dysphoric mood. The patient is not nervous/anxious.        Objective:   Physical Exam Constitutional:  Well developed, no acute distress  HENT:  Nares patent without discharge, mild septal deviation to the left  Oropharynx without exudate, palate and uvula are normal  Eyes:  Perrla, eomi, no scleral icterus  Neck:  No JVD, no TMG  Cardiovascular:  Normal rate, regular rhythm, no rubs or gallops.  No murmurs        Intact distal pulses  Pulmonary :  Normal breath sounds, no stridor or respiratory distress   No rales, rhonchi, or wheezing  Abdominal:  Soft, nondistended, bowel sounds present.  No tenderness noted.   Musculoskeletal:  No lower extremity edema noted.  Lymph Nodes:  No cervical lymphadenopathy noted  Skin:  No cyanosis noted  Neurologic:  Alert, appropriate, moves all 4 extremities without obvious deficit.         Assessment & Plan:   

## 2013-09-07 ENCOUNTER — Encounter (HOSPITAL_COMMUNITY): Payer: BC Managed Care – PPO

## 2013-09-14 ENCOUNTER — Ambulatory Visit (HOSPITAL_COMMUNITY)
Admission: RE | Admit: 2013-09-14 | Discharge: 2013-09-14 | Disposition: A | Discharge status: Home / Self Care | Payer: BC Managed Care – PPO | Source: Ambulatory Visit | Attending: Pulmonary Disease | Admitting: Pulmonary Disease

## 2013-09-14 ENCOUNTER — Ambulatory Visit (HOSPITAL_COMMUNITY): Payer: BC Managed Care – PPO

## 2013-09-14 ENCOUNTER — Encounter (HOSPITAL_COMMUNITY)
Admission: RE | Disposition: A | Discharge status: Home / Self Care | Payer: Self-pay | Source: Ambulatory Visit | Attending: Pulmonary Disease

## 2013-09-14 ENCOUNTER — Encounter (HOSPITAL_COMMUNITY): Payer: Self-pay | Admitting: Respiratory Therapy

## 2013-09-14 DIAGNOSIS — R9389 Abnormal findings on diagnostic imaging of other specified body structures: Secondary | ICD-10-CM

## 2013-09-14 DIAGNOSIS — J8410 Pulmonary fibrosis, unspecified: Secondary | ICD-10-CM | POA: Insufficient documentation

## 2013-09-14 HISTORY — PX: VIDEO BRONCHOSCOPY: SHX5072

## 2013-09-14 LAB — BODY FLUID CELL COUNT WITH DIFFERENTIAL
EOS FL: 4 %
Lymphs, Fluid: 24 %
Monocyte-Macrophage-Serous Fluid: 32 % — ABNORMAL LOW (ref 50–90)
Neutrophil Count, Fluid: 41 % — ABNORMAL HIGH (ref 0–25)
Total Nucleated Cell Count, Fluid: 34 cu mm (ref 0–1000)

## 2013-09-14 SURGERY — BRONCHOSCOPY, WITH FLUOROSCOPY
Anesthesia: Moderate Sedation | Laterality: Bilateral

## 2013-09-14 MED ORDER — BUTAMBEN-TETRACAINE-BENZOCAINE 2-2-14 % EX AERO: 1.0000 | INHALATION_SPRAY | Freq: Once | CUTANEOUS | Status: DC

## 2013-09-14 MED ORDER — MIDAZOLAM HCL 10 MG/2ML IJ SOLN
INTRAMUSCULAR | Status: DC | PRN
  Administered 2013-09-14: 5 mg via INTRAVENOUS
  Administered 2013-09-14 (×2): 2.5 mg via INTRAVENOUS

## 2013-09-14 MED ORDER — MIDAZOLAM HCL 10 MG/2ML IJ SOLN
INTRAMUSCULAR | Status: AC
  Filled 2013-09-14: qty 4

## 2013-09-14 MED ORDER — LIDOCAINE HCL 1 % IJ SOLN
INTRAMUSCULAR | Status: DC | PRN
Start: 2013-09-14 — End: 2013-09-14
  Administered 2013-09-14: 6 mL via RESPIRATORY_TRACT

## 2013-09-14 MED ORDER — PHENYLEPHRINE HCL 0.25 % NA SOLN: 1.0000 | Freq: Four times a day (QID) | NASAL | Status: DC | PRN

## 2013-09-14 MED ORDER — SODIUM CHLORIDE 0.9 % IV SOLN
INTRAVENOUS | Status: DC
  Administered 2013-09-14: 08:00:00 via INTRAVENOUS

## 2013-09-14 MED ORDER — PHENYLEPHRINE HCL 0.25 % NA SOLN
NASAL | Status: DC | PRN
Start: 2013-09-14 — End: 2013-09-14
  Administered 2013-09-14: 1 via NASAL

## 2013-09-14 MED ORDER — MEPERIDINE HCL 25 MG/ML IJ SOLN
INTRAMUSCULAR | Status: DC | PRN
  Administered 2013-09-14: 50 mg via INTRAVENOUS

## 2013-09-14 MED ORDER — LIDOCAINE HCL 2 % EX GEL
CUTANEOUS | Status: DC | PRN
Start: 2013-09-14 — End: 2013-09-14
  Administered 2013-09-14: 1

## 2013-09-14 MED ORDER — MEPERIDINE HCL 100 MG/ML IJ SOLN
INTRAMUSCULAR | Status: AC
  Filled 2013-09-14: qty 2

## 2013-09-14 MED ORDER — LIDOCAINE HCL 2 % EX GEL: Freq: Once | CUTANEOUS | Status: DC

## 2013-09-14 NOTE — Discharge Instructions (Signed)
Flexible Bronchoscopy, Care After These instructions give you information on caring for yourself after your procedure. Your doctor may also give you more specific instructions. Call your doctor if you have any problems or questions after your procedure. HOME CARE  Do not eat or drink anything for 2 hours after your procedure. If you try to eat or drink before the medicine wears off, food or drink could go into your lungs. You could also burn yourself.  After 2 hours have passed and when you can cough and gag normally, you may eat soft food and drink liquids slowly.  The day after the test, you may eat your normal diet.  You may do your normal activities.  Keep all doctor visits. GET HELP RIGHT AWAY IF:  You get more and more short of breath.  You get lightheaded.  You feel like you are going to pass out (faint).  You have chest pain.  You have new problems that worry you.  You cough up more than a little blood.  You cough up more blood than before. MAKE SURE YOU:  Understand these instructions.  Will watch your condition.  Will get help right away if you are not doing well or get worse. Document Released: 04/13/2009 Document Revised: 04/06/2013 Document Reviewed: 02/18/2013 [facility] Patient Information 2014 [place].  Nothing to eat  or drink until    11:00 amToday   09/14/2013

## 2013-09-14 NOTE — Op Note (Signed)
NAMEDORRELL, MITCHELTREE NO.:  [id number]  MEDICAL RECORD NO.:  [id number]  LOCATION:  [facility]                         FACILITY:  [facility]  PHYSICIAN:  Kathee Delton, MD,FCCPDATE OF BIRTH:  04[birth year]04-1951  DATE OF PROCEDURE:  09/14/2013 DATE OF DISCHARGE:  09/14/2013                              OPERATIVE REPORT   PROCEDURE:  Flexible fiberoptic bronchoscopy with biopsy.  INDICATION FOR THE PROCEDURE:  Nodular airspace disease bilaterally with a left upper lobe masslike density of unknown origin.  OPERATOR:  Kathee Delton, MD, FCCP.  ANESTHESIA:  Versed 10 mg IV in various aliquots, also Demerol 50 mg IV. Finally, topical 1% lidocaine in the vocal cords and airways during the procedure.  DESCRIPTION OF PROCEDURE:  After obtaining informed consent and under close cardiopulmonary monitoring, the above preop anesthesia was given and the fiberoptic scope was passed to the right naris and into the posterior pharynx, where there was no lesions or other abnormalities seen.  The vocal cords appeared to be within normal limits and moved bilaterally on phonation.  The scope was then passed into the trachea, where he was examined along its entire length down to the level of the carina, all of which was normal.  The right tracheobronchial tree was examined serially to the subsegmental level with no endobronchial abnormality being found.  The scope was then passed into the left tracheobronchial tree where nodular cobblestoning was noted endobronchially at the distal left mainstem.  There were areas of this nodularity at various locations throughout the left great tracheobronchial tree.  The left lower lobe bronchus had no endobronchial obstruction, but there were inflammatory changes in the left upper lobe bronchus and segments.  Bronchoalveolar lavage was then done from the apical posterior segment of the left upper lobe and sent for the usual cytologic and bacteriologic  evaluation.  Under fluoroscopic guidance, bronchial brushes and transbronchial lung biopsies were done of the left upper lobe mass in the apex of the left lung with good material being obtained.  Transbronchial biopsies were also done of the lung parenchyma in the left upper lobe and lingula, with good specimens being obtained.  Finally, endobronchial biopsies were done with some difficulty in the distal left mainstem bronchus to evaluate the abnormal cobblestoning.  It was very difficult to get biopsies because of the angulation along the wall.  Overall, the patient tolerated the procedure well and there were no complications.  Good hemostasis and vital signs were maintained throughout the procedure. Chest x-ray is pending.  Rule out pneumothorax post transbronchial lung biopsy.     Kathee Delton, MD,FCCP    [facility]/MED[facility]  D:  09/14/2013  T:  09/14/2013  Job:  [id number]

## 2013-09-14 NOTE — Interval H&P Note (Signed)
History and Physical Interval Note:  09/14/2013 7:38 AM  William [name]  has presented today for surgery, with the diagnosis of Abnormal Chest Xray  The various methods of treatment have been discussed with the patient and family. After consideration of risks, benefits and other options for treatment, the patient has consented to  Procedure(s): VIDEO BRONCHOSCOPY WITH FLUORO (Bilateral) as a surgical intervention .  The patient's history has been reviewed, patient examined, no change in status, stable for surgery.  I have reviewed the patient's chart and labs.  Questions were answered to the patient's satisfaction.     Kathee Delton

## 2013-09-14 NOTE — Progress Notes (Signed)
Video Bronchoscopy done  Procedure tolerated  Well  Intervention Bronchial washing  Intervention Bronchial biopsy  X 3  Different areas Intervention bronchial brushing

## 2013-09-14 NOTE — H&P (View-Only) (Signed)
   Subjective:    Patient ID: Epimenio Foot, male    DOB: 04-Dec-1950, 63 y.o.   MRN: [id number]  HPI The patient is a 63 year old male who I've been asked to see for an abnormal CT chest and ongoing cough. His cough started in January of this year along with audible wheezing, but he does not feel it is been progressive in nature. He is been treated with antibiotics and cough suppressants with some improvement, but continues to be an ongoing problem. He has had a recent CT chest that showed significant micronodule air he primarily in the upper lobes, no lymphadenopathy, and a left upper lobe density that may have been present in the past. The patient denies any shortness of breath, and exercises on irregular basis. He is eating well, and has not had any weight loss. He does work in the [employer], but is not exposed to any particulate matter of significance.  He denies any exposures to particulate matter through his life, including silica. He has never participated in sandblasting of any type, has no unusual hobbies, and has no unusual pets or birds. He denies any exposures to birds, nor has he participated in renovation work.  He does note there is significant mold on the walls at his workplace, and they often have to washdown with bleach in order to clear the area. However, it comes right back. He has no issues with mold in his home.   Review of Systems  Constitutional: Negative for fever and unexpected weight change.  HENT: Negative for congestion, dental problem, ear pain, nosebleeds, postnasal drip, rhinorrhea, sinus pressure, sneezing, sore throat and trouble swallowing.   Eyes: Negative for redness and itching.  Respiratory: Positive for cough. Negative for chest tightness, shortness of breath and wheezing.   Cardiovascular: Positive for chest pain. Negative for palpitations and leg swelling.  Gastrointestinal: Negative for nausea and vomiting.  Genitourinary: Negative for dysuria.   Musculoskeletal: Negative for joint swelling.  Skin: Negative for rash.  Neurological: Negative for headaches.  Hematological: Does not bruise/bleed easily.  Psychiatric/Behavioral: Negative for dysphoric mood. The patient is not nervous/anxious.        Objective:   Physical Exam Constitutional:  Well developed, no acute distress  HENT:  Nares patent without discharge, mild septal deviation to the left  Oropharynx without exudate, palate and uvula are normal  Eyes:  Perrla, eomi, no scleral icterus  Neck:  No JVD, no TMG  Cardiovascular:  Normal rate, regular rhythm, no rubs or gallops.  No murmurs        Intact distal pulses  Pulmonary :  Normal breath sounds, no stridor or respiratory distress   No rales, rhonchi, or wheezing  Abdominal:  Soft, nondistended, bowel sounds present.  No tenderness noted.   Musculoskeletal:  No lower extremity edema noted.  Lymph Nodes:  No cervical lymphadenopathy noted  Skin:  No cyanosis noted  Neurologic:  Alert, appropriate, moves all 4 extremities without obvious deficit.         Assessment & Plan:

## 2013-09-14 NOTE — Op Note (Signed)
Dictation #:  [id number]

## 2013-09-15 ENCOUNTER — Encounter (HOSPITAL_COMMUNITY): Payer: Self-pay | Admitting: Pulmonary Disease

## 2013-09-19 ENCOUNTER — Ambulatory Visit (INDEPENDENT_AMBULATORY_CARE_PROVIDER_SITE_OTHER): Payer: BC Managed Care – PPO | Admitting: Pulmonary Disease

## 2013-09-19 ENCOUNTER — Encounter: Payer: Self-pay | Admitting: Pulmonary Disease

## 2013-09-19 VITALS — BP 104/62 | HR 60 | Temp 97.6°F | Ht 69.0 in | Wt 174.4 lb

## 2013-09-19 DIAGNOSIS — J99 Respiratory disorders in diseases classified elsewhere: Secondary | ICD-10-CM

## 2013-09-19 DIAGNOSIS — D860 Sarcoidosis of lung: Secondary | ICD-10-CM

## 2013-09-19 DIAGNOSIS — D869 Sarcoidosis, unspecified: Secondary | ICD-10-CM

## 2013-09-19 MED ORDER — PREDNISONE 10 MG PO TABS: ORAL_TABLET | ORAL | Status: DC

## 2013-09-19 NOTE — Progress Notes (Signed)
   Subjective:    Patient ID: William [name], male    DOB: [birth year], 63 y.o.   MRN: [id number]  HPI The patient comes in today for followup after his recent bronchoscopy, for nodular airspace disease. He was found to have airway involvement, with endobronchial biopsy positive for granulomatous inflammation. He also had transbronchial biopsies and brushes from his lung parenchyma and left upper lobe density, all of which returned with granulomatous inflammation and negative stains. It had a long discussion with he and his wife about the results, and how they are most consistent with the diagnosis of sarcoidosis.   Review of Systems  Constitutional: Negative for fever and unexpected weight change.  HENT: Negative for congestion, dental problem, ear pain, nosebleeds, postnasal drip, rhinorrhea, sinus pressure, sneezing, sore throat and trouble swallowing.   Eyes: Negative for redness and itching.  Respiratory: Negative for cough, chest tightness, shortness of breath and wheezing.   Cardiovascular: Negative for palpitations and leg swelling.  Gastrointestinal: Negative for nausea and vomiting.  Genitourinary: Negative for dysuria.  Musculoskeletal: Negative for joint swelling.  Skin: Negative for rash.  Neurological: Negative for headaches.  Hematological: Does not bruise/bleed easily.  Psychiatric/Behavioral: Negative for dysphoric mood. The patient is not nervous/anxious.        Objective:   Physical Exam Well-developed male in no acute distress Nose without purulence or discharge noted Neck without lymphadenopathy or thyromegaly Chest clear to auscultation Cardiac exam with regular rate and rhythm Lower extremities without edema, no cyanosis Alert and oriented, moves all 4 extremities.       Assessment & Plan:

## 2013-09-19 NOTE — Assessment & Plan Note (Signed)
The patient has been diagnosed with granulomatous inflammation by his lung biopsy, and he also has endobronchial involvement. I have had a long discussion with him about sarcoid, including information about treatment, prognosis, and pathophysiology. Because he is so symptomatic with significant lung involvement, I think he needs to be treated with a course of prednisone. Will start at 40 mg a day, and taper over time. My plan is to treat for 2-3 months, and see how he responds. He will need a followup CT chest and also pulmonary function studies in the future.

## 2013-09-19 NOTE — Patient Instructions (Signed)
Will start on prednisone at 40mg  each day for 2 weeks, then decrease to 35mg  each day for 2 weeks, then 30mg  each each day until my visit in 6 weeks. Please call if having issues with the medication or change in symptoms.

## 2013-09-20 LAB — CULTURE, BAL-QUANTITATIVE: GRAM STAIN: NONE SEEN

## 2013-09-20 LAB — CULTURE, BAL-QUANTITATIVE W GRAM STAIN
Colony Count: 80000
Special Requests: NORMAL

## 2013-10-13 LAB — FUNGUS CULTURE W SMEAR
Fungal Smear: NONE SEEN
SPECIAL REQUESTS: NORMAL

## 2013-10-27 LAB — AFB CULTURE WITH SMEAR (NOT AT ARMC)
Acid Fast Smear: NONE SEEN
Special Requests: NORMAL

## 2013-10-31 ENCOUNTER — Encounter: Payer: Self-pay | Admitting: Pulmonary Disease

## 2013-10-31 ENCOUNTER — Ambulatory Visit (INDEPENDENT_AMBULATORY_CARE_PROVIDER_SITE_OTHER): Payer: BC Managed Care – PPO | Admitting: Pulmonary Disease

## 2013-10-31 VITALS — BP 102/62 | HR 50 | Temp 97.8°F | Ht 69.0 in | Wt 166.6 lb

## 2013-10-31 DIAGNOSIS — J99 Respiratory disorders in diseases classified elsewhere: Secondary | ICD-10-CM

## 2013-10-31 DIAGNOSIS — D869 Sarcoidosis, unspecified: Secondary | ICD-10-CM

## 2013-10-31 DIAGNOSIS — D860 Sarcoidosis of lung: Secondary | ICD-10-CM

## 2013-10-31 NOTE — Patient Instructions (Signed)
Take the 30mg  a day of prednisone for the rest of this week, then decrease to 25mg  each day for 2 weeks, then 20mg  each day for 2 weeks, then 15mg  each day until your visit with me the end of June sometime. Will schedule for a scan of your chest after your visit in June to followup the abnormalities. Please call me if your symptoms worsen as your prednisone is being weaned.

## 2013-10-31 NOTE — Assessment & Plan Note (Signed)
The patient's symptoms are much improved since being on the prednisone, and will start to taper his dose over the next 6 weeks down to a level of 15 mg a day. At that point, he will need a followup CT chest for evaluation.

## 2013-10-31 NOTE — Progress Notes (Signed)
   Subjective:    Patient ID: William [name], male    DOB: Nov 11, 1950, 63 y.o.   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 Since being on the prednisone, and his symptoms of cough, wheezing, and shortness of breath have all resolved. He feels that he is getting back to his usual baseline. He has had no significant issues with prednisone treatment.   Review of Systems  Constitutional: Negative for fever and unexpected weight change.  HENT: Negative for congestion, dental problem, ear pain, nosebleeds, postnasal drip, rhinorrhea, sinus pressure, sneezing, sore throat and trouble swallowing.   Eyes: Negative for redness and itching.  Respiratory: Negative for cough, chest tightness, shortness of breath and wheezing.   Cardiovascular: Negative for palpitations and leg swelling.  Gastrointestinal: Negative for nausea and vomiting.  Genitourinary: Negative for dysuria.  Musculoskeletal: Negative for joint swelling.  Skin: Negative for rash.  Neurological: Negative for headaches.  Hematological: Does not bruise/bleed easily.  Psychiatric/Behavioral: Negative for dysphoric mood. The patient is not nervous/anxious.        Objective:   Physical Exam Thin male in no acute distress Nose without purulence or discharge noted Neck without lymphadenopathy or thyromegaly Chest clear to auscultation with no wheezing Cardiac exam with regular rate and rhythm Lower extremities without edema, no cyanosis Alert and oriented, moves all 4 extremities.       Assessment & Plan:

## 2013-12-22 ENCOUNTER — Ambulatory Visit (INDEPENDENT_AMBULATORY_CARE_PROVIDER_SITE_OTHER): Payer: BC Managed Care – PPO | Admitting: Pulmonary Disease

## 2013-12-22 ENCOUNTER — Encounter (INDEPENDENT_AMBULATORY_CARE_PROVIDER_SITE_OTHER): Payer: Self-pay

## 2013-12-22 ENCOUNTER — Encounter: Payer: Self-pay | Admitting: Pulmonary Disease

## 2013-12-22 VITALS — BP 122/78 | HR 57 | Temp 97.0°F | Ht 69.0 in | Wt 166.6 lb

## 2013-12-22 DIAGNOSIS — J99 Respiratory disorders in diseases classified elsewhere: Secondary | ICD-10-CM

## 2013-12-22 DIAGNOSIS — G4710 Hypersomnia, unspecified: Secondary | ICD-10-CM | POA: Insufficient documentation

## 2013-12-22 DIAGNOSIS — D860 Sarcoidosis of lung: Secondary | ICD-10-CM

## 2013-12-22 DIAGNOSIS — D869 Sarcoidosis, unspecified: Secondary | ICD-10-CM

## 2013-12-22 NOTE — Progress Notes (Signed)
   Subjective:    Patient ID: William [name], male    DOB: December 22, 1950, 63 y.o.   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 He has been maintained on prednisone, and wean down to 10 mg a day. He feels that he is done very well with this, and has no further shortness of breath or cough.  He notes no significant side effects from the prednisone, but does note the new onset of daytime hypersomnia.  He feels that he is sleeping well, does not have frequent awakenings, but does have snoring. His bed partner has never commented on witnessed apneas. The patient has not had his blood sugar checked since being on the prednisone. The patient clearly relates the onset of these symptoms to his diagnosis of sarcoid with treatment.  He denies any other neurologic symptoms.   Review of Systems  Constitutional: Negative for fever and unexpected weight change.  HENT: Negative for congestion, dental problem, ear pain, nosebleeds, postnasal drip, rhinorrhea, sinus pressure, sneezing, sore throat and trouble swallowing.   Eyes: Negative for redness and itching.  Respiratory: Negative for cough, chest tightness, shortness of breath and wheezing.   Cardiovascular: Negative for palpitations and leg swelling.  Gastrointestinal: Negative for nausea and vomiting.  Genitourinary: Negative for dysuria.  Musculoskeletal: Negative for joint swelling.  Skin: Negative for rash.  Neurological: Negative for headaches.  Hematological: Does not bruise/bleed easily.  Psychiatric/Behavioral: Negative for dysphoric mood. The patient is not nervous/anxious.        Objective:   Physical Exam Well-developed male in no acute distress Nose without purulence or discharge noted Neck without lymphadenopathy or thyromegaly Chest with clear breath sounds, no wheezes Cardiac exam with regular rate and rhythm Lower extremities without edema, no cyanosis Alert and oriented, but does appear  sleepy at times during my visit. Moves all 4 extremities.       Assessment & Plan:

## 2013-12-22 NOTE — Patient Instructions (Signed)
Will schedule for a scan of your chest to followup your sarcoidosis.  Will call you with results. Will see what your scan shows and possibly make adjustments to your prednisone.  If your sleepiness continues, would have to consider imaging of your brain to make sure not involved with sarcoid. Talk with your wife, and see if you are having an abnormal breathing pattern during sleep. followup with me again in 4 mos

## 2013-12-22 NOTE — Assessment & Plan Note (Signed)
The patient is noting increasing daytime sleepiness since his diagnosis of sarcoid and being on prednisone. His blood sugar today is adequate, and he is no longer taking his cough syrup. He is getting adequate sleep at night, although he does snore on occasion. His wife has never commented on witnessed apneas. Certainly I cannot exclude the possibility of CNS sarcoidosis, and we may have to do an MRI of the brain if he continues to have symptoms.  Hopefully we can wean him off prednisone in the near future, and see if that helps his daytime sleepiness. In the meantime, he is to call us if he has any changes in his neurologic status.

## 2014-01-04 ENCOUNTER — Ambulatory Visit (INDEPENDENT_AMBULATORY_CARE_PROVIDER_SITE_OTHER)
Admission: RE | Admit: 2014-01-04 | Discharge: 2014-01-04 | Disposition: A | Discharge status: Home / Self Care | Payer: BC Managed Care – PPO | Source: Ambulatory Visit | Attending: Pulmonary Disease | Admitting: Pulmonary Disease

## 2014-01-04 DIAGNOSIS — D860 Sarcoidosis of lung: Secondary | ICD-10-CM

## 2014-01-04 DIAGNOSIS — J99 Respiratory disorders in diseases classified elsewhere: Secondary | ICD-10-CM

## 2014-01-04 DIAGNOSIS — D869 Sarcoidosis, unspecified: Secondary | ICD-10-CM

## 2014-01-10 ENCOUNTER — Telehealth: Payer: Self-pay | Admitting: Pulmonary Disease

## 2014-01-10 NOTE — Telephone Encounter (Signed)
lmam [phone] ok to leave msg

## 2014-01-10 NOTE — Telephone Encounter (Signed)
lmtcb x1 

## 2014-01-10 NOTE — Telephone Encounter (Signed)
Let pt know that his ct shows considerable improvement since being on prednisone. I would like him to decrease his prednisone to 5mg  a day for 2 weeks, then 5mg  every other day for 2 weeks, then stop. He is to call me if increase in symptoms as the dose is tapered or once he is off prednisone ---  [id number]

## 2014-01-10 NOTE — Telephone Encounter (Signed)
Pt is returned call & asks [place] to call his spouse at [phone].  Satira Anis

## 2014-01-10 NOTE — Telephone Encounter (Signed)
Pt aware of recs per [place]. Nothing further needed. 

## 2014-02-18 ENCOUNTER — Encounter: Payer: Self-pay | Admitting: Gastroenterology

## 2014-04-27 ENCOUNTER — Ambulatory Visit (INDEPENDENT_AMBULATORY_CARE_PROVIDER_SITE_OTHER): Payer: BC Managed Care – PPO | Admitting: Internal Medicine

## 2014-04-27 ENCOUNTER — Ambulatory Visit (INDEPENDENT_AMBULATORY_CARE_PROVIDER_SITE_OTHER)
Admission: RE | Admit: 2014-04-27 | Discharge: 2014-04-27 | Disposition: A | Discharge status: Home / Self Care | Payer: BC Managed Care – PPO | Source: Ambulatory Visit | Attending: Internal Medicine | Admitting: Internal Medicine

## 2014-04-27 ENCOUNTER — Other Ambulatory Visit: Payer: BC Managed Care – PPO

## 2014-04-27 ENCOUNTER — Encounter: Payer: Self-pay | Admitting: Internal Medicine

## 2014-04-27 VITALS — BP 106/70 | HR 53 | Ht 69.0 in | Wt 164.8 lb

## 2014-04-27 DIAGNOSIS — L409 Psoriasis, unspecified: Secondary | ICD-10-CM | POA: Insufficient documentation

## 2014-04-27 DIAGNOSIS — D860 Sarcoidosis of lung: Secondary | ICD-10-CM

## 2014-04-27 NOTE — Patient Instructions (Signed)
Order- CXR   Dx sarcoid  Order- lab- ACE level    Dx sarcoid  Please make a follow-up appointment to see Dr Gwenette Greet in about 4 months, but certainly call sooner if needed

## 2014-04-27 NOTE — Progress Notes (Signed)
Subjective:    Patient ID: William [name], male    DOB: 09-21-1950, 63 y.o.   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 He has been maintained on prednisone, and wean down to 10 mg a day. He feels that he is done very well with this, and has no further shortness of breath or cough.  He notes no significant side effects from the prednisone, but does note the new onset of daytime hypersomnia.  He feels that he is sleeping well, does not have frequent awakenings, but does have snoring. His bed partner has never commented on witnessed apneas. The patient has not had his blood sugar checked since being on the prednisone. The patient clearly relates the onset of these symptoms to his diagnosis of sarcoid with treatment.  He denies any other neurologic symptoms.   04/27/14- Acute visit- Dr Gwenette Greet out. Patient followed for pulmonary sarcoid on prednisone FOLLOWS FOR: Patient is off Prednisone. Patient has occasional cough with some phlegm production. Patient denies wheezing and chest congestion. He stopped prednisone as planned at last ov. Notes only minimal dry cough. Originally presented with cough and wheeze. Denies sweats, nodes, chest pain.  Had flu vax and pending pneumonia vax. CT chest 01/04/14- at end of prednisone  IMPRESSION:  1. Marked reduction in volume of masslike consolidative nodule in  the left upper with now only a small nodule remaining.  2. Reduction in peribronchovascular nodularity consistent with  improvement in pulmonary sarcoidosis.  3. Mild reduction in right hilar adenopathy.  Electronically Signed  By: Suzy Bouchard M.D.  On: 01/04/2014 13:11  ROS-see HPI Constitutional:   No-   weight loss, night sweats, fevers, chills, fatigue, lassitude. HEENT:   No-  headaches, difficulty swallowing, tooth/dental problems, sore throat,       No-  sneezing, itching, ear ache, nasal congestion, post nasal drip,  CV:  No-    chest pain, orthopnea, PND, swelling in lower extremities, anasarca,                                  dizziness, palpitations Resp: No-   shortness of breath with exertion or at rest.              No-   productive cough,  + non-productive cough,  No- coughing up of blood.              No-   change in color of mucus.  No- wheezing.   Skin: No-   rash or lesions. GI:  No-   heartburn, indigestion, abdominal pain, nausea, vomiting, GU: . MS:  No-   joint pain or swelling.   Neuro-     nothing unusual Psych:  No- change in mood or affect. No depression or anxiety.  No memory loss.  Objective:   OBJ- Physical Exam General- Alert, Oriented, Affect-appropriate, Distress- none acute, trim, looks well Skin- +incidental psoriasis Lymphadenopathy- none Head- atraumatic            Eyes- Gross vision intact, PERRLA, conjunctivae and secretions clear            Ears- Hearing, canals-normal            Nose- Clear, no-Septal dev, mucus, polyps, erosion, perforation             Throat- Mallampati II , mucosa clear , drainage- none, tonsils- atrophic  Neck- flexible , trachea midline, no stridor , thyroid nl, carotid no bruit Chest - symmetrical excursion , unlabored           Heart/CV- RRR , no murmur , no gallop  , no rub, nl s1 s2                           - JVD- none , edema- none, stasis changes- none, varices- none           Lung- clear to P&A, wheeze- none, cough- none , dullness-none, rub- none           Chest wall-  Abd-  Br/ Gen/ Rectal- Not done, not indicated Extrem- cyanosis- none, clubbing, none, atrophy- none, strength- nl Neuro- grossly intact to observation     Assessment & Plan:

## 2014-04-27 NOTE — Assessment & Plan Note (Signed)
He denies symptom or mood change off prednisone now 4 months. Plan- stay off prednisone, update CXR, ACE level, return to f/u with Dr Gwenette Greet

## 2014-04-27 NOTE — Addendum Note (Signed)
Addended by: Parke Poisson E on: 04/27/2014 09:38 AM   Modules accepted: Orders

## 2014-04-28 ENCOUNTER — Telehealth: Payer: Self-pay | Admitting: Internal Medicine

## 2014-04-28 LAB — ANGIOTENSIN CONVERTING ENZYME: Angiotensin-Converting Enzyme: 36 U/L (ref 8–52)

## 2014-04-28 NOTE — Progress Notes (Signed)
Quick Note:  lmtcb ______ 

## 2014-05-01 NOTE — Telephone Encounter (Signed)
Called and spoke with pt and he is aware of lab and cxr results per CY.Marland Kitchen  Pt voiced his understanding and nothing further is needed.

## 2014-08-28 ENCOUNTER — Ambulatory Visit: Payer: BC Managed Care – PPO | Admitting: Pulmonary Disease

## 2014-08-30 ENCOUNTER — Ambulatory Visit (INDEPENDENT_AMBULATORY_CARE_PROVIDER_SITE_OTHER): Payer: BLUE CROSS/BLUE SHIELD | Admitting: Pulmonary Disease

## 2014-08-30 ENCOUNTER — Encounter: Payer: Self-pay | Admitting: Pulmonary Disease

## 2014-08-30 ENCOUNTER — Encounter (INDEPENDENT_AMBULATORY_CARE_PROVIDER_SITE_OTHER): Payer: Self-pay

## 2014-08-30 VITALS — BP 120/62 | HR 60 | Temp 97.0°F | Ht 69.0 in | Wt 162.0 lb

## 2014-08-30 DIAGNOSIS — D860 Sarcoidosis of lung: Secondary | ICD-10-CM

## 2014-08-30 NOTE — Assessment & Plan Note (Signed)
The patient has done very well since the last visit from a pulmonary standpoint. He currently has no pulmonary or constitutional symptoms, and feels that he is doing well. His lungs are totally clear on exam today. At this point, I would like to follow him up on a yearly basis, but he is to call me if he has increased symptoms. We'll also check a chest x-ray at the next visit. If he continues to be stable after a year or 2, he can then be followed up on an as-needed basis. I have reminded him to make sure and get his eye exam yearly.

## 2014-08-30 NOTE — Progress Notes (Signed)
   Subjective:    Patient ID: William [name], male    DOB: 19-Oct-1950, 64 y.o.   MRN: [id number]  HPI The patient comes in today for follow-up of his pulmonary sarcoidosis. He has been off steroids since the summer of last year, and has had no significant symptoms. He denies any shortness of breath, cough, or other constitutional symptoms. He feels well, and has kept up with his eye exams so far. He is currently due for a follow-up eye exam at this time. His last chest x-ray was in October of last year and showed stability of his mild upper lobe nodularity.   Review of Systems  Constitutional: Negative for fever and unexpected weight change.  HENT: Negative for congestion, dental problem, ear pain, nosebleeds, postnasal drip, rhinorrhea, sinus pressure, sneezing, sore throat and trouble swallowing.   Eyes: Negative for redness and itching.  Respiratory: Negative for cough, chest tightness, shortness of breath and wheezing.   Cardiovascular: Negative for palpitations and leg swelling.  Gastrointestinal: Negative for nausea and vomiting.  Genitourinary: Negative for dysuria.  Musculoskeletal: Negative for joint swelling.  Skin: Negative for rash.  Neurological: Negative for headaches.  Hematological: Does not bruise/bleed easily.  Psychiatric/Behavioral: Negative for dysphoric mood. The patient is not nervous/anxious.        Objective:   Physical Exam Thin male in no acute distress Nose without purulence or discharge noted Neck without lymphadenopathy or thyromegaly Chest totally clear to auscultation, no wheezing Cardiac exam with regular rate and rhythm Lower extremities without edema, no cyanosis Alert and oriented, moves all 4 extremities.       Assessment & Plan:

## 2014-08-30 NOTE — Patient Instructions (Signed)
Will see you back in one year, with a chest xray on the same day. Make sure you get your eye exam yearly with your history of sarcoid.

## 2015-02-25 ENCOUNTER — Encounter: Payer: Self-pay | Admitting: Internal Medicine

## 2015-05-23 ENCOUNTER — Encounter: Payer: Self-pay | Admitting: Pulmonary Disease

## 2015-08-30 ENCOUNTER — Ambulatory Visit: Payer: BLUE CROSS/BLUE SHIELD | Admitting: Pulmonary Disease

## 2015-08-31 ENCOUNTER — Ambulatory Visit: Payer: BLUE CROSS/BLUE SHIELD | Admitting: Pulmonary Disease

## 2015-09-07 ENCOUNTER — Ambulatory Visit: Payer: BLUE CROSS/BLUE SHIELD | Admitting: Pulmonary Disease

## 2015-09-07 ENCOUNTER — Encounter (INDEPENDENT_AMBULATORY_CARE_PROVIDER_SITE_OTHER): Payer: Self-pay

## 2015-09-07 ENCOUNTER — Ambulatory Visit (INDEPENDENT_AMBULATORY_CARE_PROVIDER_SITE_OTHER)
Admission: RE | Admit: 2015-09-07 | Discharge: 2015-09-07 | Disposition: A | Discharge status: Home / Self Care | Payer: BLUE CROSS/BLUE SHIELD | Source: Ambulatory Visit | Attending: Pulmonary Disease | Admitting: Pulmonary Disease

## 2015-09-07 ENCOUNTER — Telehealth: Payer: Self-pay | Admitting: Pulmonary Disease

## 2015-09-07 ENCOUNTER — Ambulatory Visit (INDEPENDENT_AMBULATORY_CARE_PROVIDER_SITE_OTHER): Payer: BLUE CROSS/BLUE SHIELD | Admitting: Pulmonary Disease

## 2015-09-07 ENCOUNTER — Encounter: Payer: Self-pay | Admitting: Pulmonary Disease

## 2015-09-07 VITALS — BP 122/58 | HR 64 | Ht 69.0 in | Wt 168.8 lb

## 2015-09-07 DIAGNOSIS — Z23 Encounter for immunization: Secondary | ICD-10-CM

## 2015-09-07 DIAGNOSIS — D860 Sarcoidosis of lung: Secondary | ICD-10-CM

## 2015-09-07 NOTE — Progress Notes (Signed)
Subjective:    Patient ID: William [name], male    DOB: 1950-11-08, 65 y.o.   MRN: [id number]  C.C.:  Follow-up for Pulmonary Sarcoidosis.  HPI Pulmonary Sarcoidosis: Diagnosed with bronchoscopy March 2015. Patient has been off of steroid therapy since summer of 2015. The patient confirms this. He was treated with a short course after his original diagnosis. He is getting yearly eye evaluations by ophthalmologist. Denies any swelling, pain, or erythema of eyes. No heart palpitations. No chest pain or pressure. No syncope or near syncope. Denies any dyspnea. Has a rare cough.   Review of Systems No fever, chills, or sweats. No abdominal pain, nausea, emesis, or diarrhea. No rashes or bruising. No focal vision loss, weakness, numbness, or tingling.   No Known Allergies  Current Outpatient Prescriptions on File Prior to Visit  Medication Sig Dispense Refill  . fluticasone (FLONASE) 50 MCG/ACT nasal spray Place 2 sprays into both nostrils as needed.      No current facility-administered medications on file prior to visit.    Past Medical History  Diagnosis Date  . Hyperlipidemia   . Sarcoidosis ([place]) 08/2013    Diagnosed with bronchoscopy. Pulmonary Involvement only.     Past Surgical History  Procedure Laterality Date  . Cholecystectomy    . Video bronchoscopy Bilateral 09/14/2013    Procedure: VIDEO BRONCHOSCOPY WITH FLUORO;  Surgeon: Kathee Delton, MD;  Location: [facility];  Service: Cardiopulmonary;  Laterality: Bilateral;  . Colonoscopy      Family History  Problem Relation Age of Onset  . Allergies Father   . Heart disease Father   . Lung disease Neg Hx   . Rheumatologic disease Neg Hx     Social History   Social History  . Marital Status: Married    Spouse Name: N/A  . Number of Children: N/A  . Years of Education: N/A   Occupational History  . [employer]   Social History Main Topics  . Smoking status: Never Smoker   . Smokeless tobacco: None    . Alcohol Use: No  . Drug Use: No  . Sexual Activity: Not Asked   Other Topics Concern  . None   Social History Narrative   Originally from [place]. Always lived in [place]. Previously traveled to [place]. No international travel. No pets currently. No bird, mold, or hot tub exposure. 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       Objective:   Physical Exam BP 122/58 mmHg  Pulse 64  Ht 5\' 9"  (1.753 m)  Wt 168 lb 12.8 oz (76.567 kg)  BMI 24.92 kg/m2  SpO2 98% General:  Awake. Alert. No acute distress. Caucasian male.  Integument:  Warm & dry. No rash on exposed skin. No bruising. HEENT:  Moist mucus membranes. No oral ulcers. No scleral injection or icterus. Minimal nasal turbinate swelling. Cardiovascular:  Regular rate. No edema. No appreciable JVD.  Pulmonary:  Good aeration & clear to auscultation bilaterally. Symmetric chest wall expansion. No accessory muscle use. Abdomen: Soft. Normal bowel sounds. Nondistended. Grossly nontender. Musculoskeletal:  Normal bulk and tone. Hand grip strength 5/5 bilaterally. No joint deformity or effusion appreciated.  IMAGING CT CHEST W/O 01/04/14 (personally reviewed by me): No pleural effusion or thickening. Reduce size of left upper lobe consolidation/mass. Diffuse peri-bronchovascular nodularity. Right hilar lymph node measuring approximately 2 cm. No other nodule or mass appreciated. No pericardial effusion. Patulous  esophagus.  EKG 09/07/15 (personally reviewed by me): Sinus bradycardia at 50bpm. QTc 439 ms. No evidence of conduction abnormality or ischemia.  PATHOLOGY BRUSH LUL (09/14/13): Reactive bronchial epithelial cells with fragments of granuloma. No atypia or malignancy. BAL LUL (09/14/13): Rare atypical cells. Abundant histiocytes. Acute and chronic inflammation. FNA & TBBx LUL (09/14/13): Chronic nonnecrotizing granulomatous inflammation with granulomata. AFB,  GMS, & PAS stains negative. No atypia or malignancy.  MICROBIOLOGY BAL LUL (09/14/13):  Oral Flora / AFB negative / Fungal negative   LABS 09/14/13 BAL LUL:  WBC 34 (24% lymph, 4% eos, 41% neutro, 32% macro)  04/17/14 ACE:  36    Assessment & Plan:  65 year old male with pulmonary sarcoidosis. Patient is relatively asymptomatic with a very mild and minimal cough. May have some element of allergic rhinitis contributing. He has remained off of steroids for his sarcoidosis which seems to be limited to his pulmonary system. I spent a significant amount time today educating the patient on the potential organ involvement with neurosarcoidosis, cardiac sarcoidosis, ocular sarcoidosis, and bone marrow involvement as well as other organ systems. I instructed the patient to contact me if he had any new symptoms or new breathing problems before his next appointment.  1. Pulmonary Sarcoidosis: Checking for pulmonary function testing within the next 1-2 months. Plan to repeat full pulmonary function testing at next appointment to ensure continued stability of lung function. Holding on steroid or immunosuppression at this time given lack of symptoms. Checking chest x-ray PA/LAT today. Checking EKG at next appointment. Patient to have his blood work faxed to my office for my review. Advised he needs to continue with yearly ophthalmologic exam to rule out I involvement of sarcoidosis. 2. Health maintenance: Influenza vaccine October 2016. Administering Pneumovax today. 3. Follow-up: One year or sooner if needed.  Sonia Baller Ashok Cordia, M.D. [facility]monary [facility] Pager:  [phone] After 3pm or if no response, call [phone] 9:05 AM 09/07/2015

## 2015-09-07 NOTE — Telephone Encounter (Signed)
[phone] pt calling back

## 2015-09-07 NOTE — Telephone Encounter (Signed)
I called patient and scheduled his PFT.  Put in 1 year recall for ROV with Dr. Ashok Cordia. May close.

## 2015-09-07 NOTE — Patient Instructions (Signed)
   Call me if you have any new problems breathing  Please remember to have your blood work faxed to my office for my review to make sure you sarcoidosis is not involving your bone marrow, liver, or kidneys  We will do a breathing test within the next 1-2 months as well as a chair next appointment to ensure that your lung function is remaining stable  We will repeat an EKG at her next appointment as well  I will see back in one year or sooner if needed  TESTS ORDERED: 1. Full pulmonary function testing at follow-up appointment 2. Full pulmonary function testing within the next 1-2 months 3. EKG at follow-up appointment 4. Chest x-ray PA/LAT today

## 2015-09-07 NOTE — Telephone Encounter (Signed)
Per Mindy, We just need to schedule a PFT in 1-2 months and put a recall in for appt in 1 year w/ PFT as well and EKG.

## 2015-09-07 NOTE — Telephone Encounter (Signed)
Left message for patient to call back. Please schedule when patient calls back.  Thanks.   Jamse Arn Signed Service date: 09/07/2015 9:51 AM     Expand All Collapse All   Per Mindy, We just need to schedule a PFT in 1-2 months and put a recall in for appt in 1 year w/ PFT as well and EKG.

## 2015-10-08 DIAGNOSIS — E785 Hyperlipidemia, unspecified: Secondary | ICD-10-CM | POA: Diagnosis not present

## 2015-10-08 DIAGNOSIS — R972 Elevated prostate specific antigen [PSA]: Secondary | ICD-10-CM | POA: Diagnosis not present

## 2015-10-08 DIAGNOSIS — Z139 Encounter for screening, unspecified: Secondary | ICD-10-CM | POA: Diagnosis not present

## 2015-10-08 DIAGNOSIS — D72819 Decreased white blood cell count, unspecified: Secondary | ICD-10-CM | POA: Diagnosis not present

## 2015-10-22 DIAGNOSIS — E785 Hyperlipidemia, unspecified: Secondary | ICD-10-CM | POA: Diagnosis not present

## 2015-10-22 DIAGNOSIS — R972 Elevated prostate specific antigen [PSA]: Secondary | ICD-10-CM | POA: Diagnosis not present

## 2015-10-22 DIAGNOSIS — J309 Allergic rhinitis, unspecified: Secondary | ICD-10-CM | POA: Diagnosis not present

## 2015-10-24 ENCOUNTER — Telehealth: Payer: Self-pay | Admitting: Pulmonary Disease

## 2015-10-24 NOTE — Telephone Encounter (Signed)
LABS 10/08/15 CBC: 4.1/15.3/45.8/146 BMP: 139/3.9/102/25/20/1.0/88/9.3 LFT: 4.2/7.4/0.9/77/19/15

## 2015-11-07 ENCOUNTER — Ambulatory Visit (INDEPENDENT_AMBULATORY_CARE_PROVIDER_SITE_OTHER): Payer: BLUE CROSS/BLUE SHIELD | Admitting: Pulmonary Disease

## 2015-11-07 DIAGNOSIS — D860 Sarcoidosis of lung: Secondary | ICD-10-CM | POA: Diagnosis not present

## 2015-11-07 LAB — PULMONARY FUNCTION TEST
DL/VA % pred: 127 %
DL/VA: 5.82 ml/min/mmHg/L
DLCO COR % PRED: 84 %
DLCO COR: 26.31 ml/min/mmHg
DLCO UNC: 26.82 ml/min/mmHg
DLCO unc % pred: 86 %
FEF 25-75 POST: 1.82 L/s
FEF 25-75 Pre: 1.33 L/sec
FEF2575-%Change-Post: 36 %
FEF2575-%PRED-PRE: 48 %
FEF2575-%Pred-Post: 66 %
FEV1-%CHANGE-POST: 5 %
FEV1-%PRED-POST: 71 %
FEV1-%PRED-PRE: 67 %
FEV1-POST: 2.42 L
FEV1-Pre: 2.29 L
FEV1FVC-%Change-Post: 6 %
FEV1FVC-%PRED-PRE: 94 %
FEV6-%CHANGE-POST: 0 %
FEV6-%PRED-POST: 74 %
FEV6-%Pred-Pre: 74 %
FEV6-PRE: 3.18 L
FEV6-Post: 3.18 L
FEV6FVC-%CHANGE-POST: 0 %
FEV6FVC-%PRED-POST: 104 %
FEV6FVC-%Pred-Pre: 104 %
FVC-%CHANGE-POST: 0 %
FVC-%PRED-POST: 71 %
FVC-%Pred-Pre: 71 %
FVC-PRE: 3.22 L
FVC-Post: 3.2 L
POST FEV6/FVC RATIO: 100 %
PRE FEV1/FVC RATIO: 71 %
Post FEV1/FVC ratio: 76 %
Pre FEV6/FVC Ratio: 99 %
RV % pred: 22 %
RV: 0.5 L
TLC % PRED: 62 %
TLC: 4.27 L

## 2015-11-07 NOTE — Progress Notes (Signed)
PFT done today. 

## 2016-03-05 DIAGNOSIS — D869 Sarcoidosis, unspecified: Secondary | ICD-10-CM | POA: Diagnosis not present

## 2016-03-05 DIAGNOSIS — Z008 Encounter for other general examination: Secondary | ICD-10-CM | POA: Diagnosis not present

## 2016-03-05 DIAGNOSIS — R972 Elevated prostate specific antigen [PSA]: Secondary | ICD-10-CM | POA: Diagnosis not present

## 2016-03-05 DIAGNOSIS — E785 Hyperlipidemia, unspecified: Secondary | ICD-10-CM | POA: Diagnosis not present

## 2016-04-08 DIAGNOSIS — Z23 Encounter for immunization: Secondary | ICD-10-CM | POA: Diagnosis not present

## 2016-04-23 DIAGNOSIS — H04123 Dry eye syndrome of bilateral lacrimal glands: Secondary | ICD-10-CM | POA: Diagnosis not present

## 2016-04-23 DIAGNOSIS — H43393 Other vitreous opacities, bilateral: Secondary | ICD-10-CM | POA: Diagnosis not present

## 2016-04-23 DIAGNOSIS — H2513 Age-related nuclear cataract, bilateral: Secondary | ICD-10-CM | POA: Diagnosis not present

## 2016-04-23 DIAGNOSIS — H43811 Vitreous degeneration, right eye: Secondary | ICD-10-CM | POA: Diagnosis not present

## 2016-06-11 DIAGNOSIS — N401 Enlarged prostate with lower urinary tract symptoms: Secondary | ICD-10-CM | POA: Diagnosis not present

## 2016-06-18 ENCOUNTER — Other Ambulatory Visit: Payer: Self-pay | Admitting: Urology

## 2016-06-18 DIAGNOSIS — N401 Enlarged prostate with lower urinary tract symptoms: Secondary | ICD-10-CM | POA: Diagnosis not present

## 2016-06-18 DIAGNOSIS — R3912 Poor urinary stream: Secondary | ICD-10-CM | POA: Diagnosis not present

## 2016-06-18 DIAGNOSIS — R972 Elevated prostate specific antigen [PSA]: Secondary | ICD-10-CM

## 2016-07-04 ENCOUNTER — Ambulatory Visit (HOSPITAL_COMMUNITY): Payer: BLUE CROSS/BLUE SHIELD

## 2016-07-07 ENCOUNTER — Ambulatory Visit (HOSPITAL_COMMUNITY)
Admission: RE | Admit: 2016-07-07 | Discharge: 2016-07-07 | Disposition: A | Discharge status: Home / Self Care | Payer: BLUE CROSS/BLUE SHIELD | Source: Ambulatory Visit | Attending: Urology | Admitting: Urology

## 2016-07-07 DIAGNOSIS — N402 Nodular prostate without lower urinary tract symptoms: Secondary | ICD-10-CM | POA: Diagnosis not present

## 2016-07-07 DIAGNOSIS — R972 Elevated prostate specific antigen [PSA]: Secondary | ICD-10-CM | POA: Insufficient documentation

## 2016-07-07 MED ORDER — GADOBENATE DIMEGLUMINE 529 MG/ML IV SOLN: 20.0000 mL | Freq: Once | INTRAVENOUS | Status: DC | PRN

## 2016-07-23 ENCOUNTER — Emergency Department (HOSPITAL_COMMUNITY)
Admission: EM | Admit: 2016-07-23 | Discharge: 2016-07-23 | Disposition: A | Discharge status: Home / Self Care | Payer: BLUE CROSS/BLUE SHIELD | Attending: Emergency Medicine | Admitting: Emergency Medicine

## 2016-07-23 ENCOUNTER — Encounter (HOSPITAL_COMMUNITY): Payer: Self-pay | Admitting: Cardiology

## 2016-07-23 ENCOUNTER — Emergency Department (HOSPITAL_COMMUNITY): Payer: BLUE CROSS/BLUE SHIELD

## 2016-07-23 DIAGNOSIS — R079 Chest pain, unspecified: Secondary | ICD-10-CM

## 2016-07-23 DIAGNOSIS — R0789 Other chest pain: Secondary | ICD-10-CM | POA: Diagnosis not present

## 2016-07-23 DIAGNOSIS — Z79899 Other long term (current) drug therapy: Secondary | ICD-10-CM | POA: Insufficient documentation

## 2016-07-23 LAB — BASIC METABOLIC PANEL
ANION GAP: 7 (ref 5–15)
BUN: 20 mg/dL (ref 6–20)
CHLORIDE: 101 mmol/L (ref 101–111)
CO2: 29 mmol/L (ref 22–32)
Calcium: 8.9 mg/dL (ref 8.9–10.3)
Creatinine, Ser: 1.07 mg/dL (ref 0.61–1.24)
GFR calc Af Amer: >60 mL/min (ref >60)
GFR calc non Af Amer: >60 mL/min (ref >60)
Glucose, Bld: 97 mg/dL (ref 65–99)
POTASSIUM: 3.9 mmol/L (ref 3.5–5.1)
Sodium: 137 mmol/L (ref 135–145)

## 2016-07-23 LAB — CBC
HEMATOCRIT: 44.1 % (ref 39.0–52.0)
HEMOGLOBIN: 15.3 g/dL (ref 13.0–17.0)
MCH: 31.5 pg (ref 26.0–34.0)
MCHC: 34.7 g/dL (ref 30.0–36.0)
MCV: 90.7 fL (ref 78.0–100.0)
Platelets: 185 10*3/uL (ref 150–400)
RBC: 4.86 MIL/uL (ref 4.22–5.81)
RDW: 12.9 % (ref 11.5–15.5)
WBC: 4.3 10*3/uL (ref 4.0–10.5)

## 2016-07-23 LAB — TROPONIN I: Troponin I: <0.03 ng/mL (ref <0.03)

## 2016-07-23 NOTE — ED Triage Notes (Signed)
Chest pain since last night.  Went to nurse at work  today and sent to [facility].   Had 324 mg aspitin.

## 2016-07-23 NOTE — ED Provider Notes (Signed)
[facility] DEPT Provider Note   CSN: [id number] Arrival date & time: 07/23/16  1207  By signing my name below, I, Collene Leyden, attest that this documentation has been prepared under the direction and in the presence of Jola Schmidt, MD. Electronically Signed: Collene Leyden, Scribe. 07/23/16. 1:33 PM.   History   Chief Complaint Chief Complaint  Patient presents with  . Chest Pain    HPI Comments: William [name] is a 66 y.o. male with a hx of pulmonary sarcoidosis and HLD, who presents to the Emergency Department complaining of intermittent chest tightness that began last night. Patient reports having intermittent chest tightness that last a few minutes to an hour. Patient states the chest pain is no longer present. Patient states he was sitting, doing nothing when the chest pain presented. Patient has no associated symptoms. Patient reports taking 325 mg of aspirin last night. He denies dizziness, diaphoresis, cough, or congestion.   The history is provided by the patient. No language interpreter was used.    Past Medical History:  Diagnosis Date  . Hyperlipidemia   . Sarcoidosis ([place]) 08/2013   Diagnosed with bronchoscopy. Pulmonary Involvement only.     Patient Active Problem List   Diagnosis Date Noted  . Psoriasis 04/27/2014  . Pulmonary sarcoidosis ([place]) 09/05/2013    Past Surgical History:  Procedure Laterality Date  . CHOLECYSTECTOMY    . COLONOSCOPY    . VIDEO BRONCHOSCOPY Bilateral 09/14/2013   Procedure: VIDEO BRONCHOSCOPY WITH FLUORO;  Surgeon: Kathee Delton, MD;  Location: [facility];  Service: Cardiopulmonary;  Laterality: Bilateral;       Home Medications    Prior to Admission medications   Medication Sig Start Date End Date Taking? Authorizing Provider  fluticasone (FLONASE) 50 MCG/ACT nasal spray Place 2 sprays into both nostrils as needed.  08/01/13   Historical Provider, MD    Family History Family History  Problem Relation Age of Onset    . Allergies Father   . Heart disease Father   . Lung disease Neg Hx   . Rheumatologic disease Neg Hx     Social History Social History  Substance Use Topics  . Smoking status: Never Smoker  . Smokeless tobacco: Never Used  . Alcohol use No     Allergies   Patient has no known allergies.   Review of Systems Review of Systems  A complete 10 system review of systems was obtained and all systems are negative except as noted in the HPI and PMH.   Physical Exam Updated Vital Signs BP 150/78   Pulse (!) 52   Temp 97.6 F (36.4 C) (Oral)   Resp 26   Ht 5\' 7"  (1.702 m)   Wt 165 lb (74.8 kg)   SpO2 99%   BMI 25.84 kg/m   Physical Exam  Constitutional: He is oriented to person, place, and time. He appears well-developed and well-nourished.  HENT:  Head: Normocephalic and atraumatic.  Eyes: EOM are normal.  Neck: Normal range of motion.  Cardiovascular: Normal rate, regular rhythm, normal heart sounds and intact distal pulses.   Pulmonary/Chest: Effort normal and breath sounds normal. No respiratory distress.  Abdominal: Soft. He exhibits no distension. There is no tenderness.  Musculoskeletal: Normal range of motion.  Neurological: He is alert and oriented to person, place, and time.  Skin: Skin is warm and dry.  Psychiatric: He has a normal mood and affect. Judgment normal.  Nursing note and vitals reviewed.    ED Treatments /  Results  DIAGNOSTIC STUDIES: Oxygen Saturation is 99% on RA, normal by my interpretation.    COORDINATION OF CARE: 1:32 PM Discussed treatment plan with pt at bedside and pt agreed to plan.  Labs (all labs ordered are listed, but only abnormal results are displayed) Labs Reviewed  BASIC METABOLIC PANEL  CBC  TROPONIN I  TROPONIN I    EKG  EKG Interpretation  Date/Time:  Wednesday July 23 2016 12:12:42 EST Ventricular Rate:  61 PR Interval:    QRS Duration: 87 QT Interval:  431 QTC Calculation: 435 R Axis:   43 Text  Interpretation:  Sinus rhythm Consider left atrial enlargement RSR' in V1 or V2, right VCD or RVH No significant change was found Confirmed by Brodin Gelpi  MD, Lennette Bihari ([id number]) on 07/23/2016 1:20:08 PM       Radiology Dg Chest 2 View  Result Date: 07/23/2016 CLINICAL DATA:  Mid chest pain which began last night. History of sarcoidosis and hyperlipidemia. Nonsmoker. EXAM: CHEST  2 VIEW COMPARISON:  Chest x-ray of September 07, 2015 FINDINGS: The lungs are mildly hyperinflated and clear. The heart and pulmonary vascularity are normal. The mediastinum is normal in width no mediastinal or hilar lymphadenopathy is observed. There is no pleural effusion. The bony thorax exhibits no acute abnormality. IMPRESSION: Mild chronic bronchitic changes. No evidence of pneumonia nor other acute cardiopulmonary abnormality. Electronically Signed   By: David  Martinique M.D.   On: 07/23/2016 12:55    Procedures Procedures (including critical care time)  Medications Ordered in ED Medications - No data to display   Initial Impression / Assessment and Plan / ED Course  I have reviewed the triage vital signs and the nursing notes.  Pertinent labs & imaging results that were available during my care of the patient were reviewed by me and considered in my medical decision making (see chart for details).     Will obtain second troponin. ecg x 2 without abnormality. Walks regularly and never has chest discomfort. Will plan on outpatient cards follow up. Pain free in the emergency department. Pt has occasional episodes of asymptomatic sinus arrhythmia in the ER with rates down in the mid 40s. He always has P waves. No lightheadedness or presyncope/syncope  Final Clinical Impressions(s) / ED Diagnoses   Final diagnoses:  None    New Prescriptions New Prescriptions   No medications on file   I personally performed the services described in this documentation, which was scribed in my presence. The recorded information has  been reviewed and is accurate.        Jola Schmidt, MD 07/23/16 1524

## 2016-07-30 DIAGNOSIS — R972 Elevated prostate specific antigen [PSA]: Secondary | ICD-10-CM | POA: Diagnosis not present

## 2016-08-04 MED ORDER — GADOBENATE DIMEGLUMINE 529 MG/ML IV SOLN
15.0000 mL | Freq: Once | INTRAVENOUS | Status: AC | PRN
  Administered 2016-07-07: 15 mL via INTRAVENOUS

## 2016-08-04 NOTE — Addendum Note (Signed)
Encounter addended by: Nancie Neas Lamberto Dinapoli on: 08/04/2016  7:02 AM<BR>    Actions taken: Imaging Exam ended, Order list changed, MAR administration accepted

## 2016-08-08 ENCOUNTER — Ambulatory Visit (INDEPENDENT_AMBULATORY_CARE_PROVIDER_SITE_OTHER): Payer: BLUE CROSS/BLUE SHIELD | Admitting: Pulmonary Disease

## 2016-08-08 ENCOUNTER — Encounter: Payer: Self-pay | Admitting: Pulmonary Disease

## 2016-08-08 ENCOUNTER — Other Ambulatory Visit (INDEPENDENT_AMBULATORY_CARE_PROVIDER_SITE_OTHER): Payer: BLUE CROSS/BLUE SHIELD

## 2016-08-08 VITALS — BP 102/62 | HR 48 | Ht 67.0 in | Wt 175.2 lb

## 2016-08-08 DIAGNOSIS — R079 Chest pain, unspecified: Secondary | ICD-10-CM | POA: Insufficient documentation

## 2016-08-08 DIAGNOSIS — D869 Sarcoidosis, unspecified: Secondary | ICD-10-CM

## 2016-08-08 LAB — HEPATIC FUNCTION PANEL
ALK PHOS: 68 U/L (ref 39–117)
ALT: 16 U/L (ref 0–53)
AST: 17 U/L (ref 0–37)
Albumin: 4.2 g/dL (ref 3.5–5.2)
BILIRUBIN DIRECT: 0.1 mg/dL (ref 0.0–0.3)
Total Bilirubin: 0.6 mg/dL (ref 0.2–1.2)
Total Protein: 7.3 g/dL (ref 6.0–8.3)

## 2016-08-08 NOTE — Patient Instructions (Signed)
   Call me if you have any new breathing problems or questions before your next appointment.  I will see you back in 3 months with a breathing & walking test or sooner if needed.  TESTS ORDERED: 1. Full PFTs at follow-up appointment 2. 6MWT on room air at follow-up appointment 3. Serum LFTs and ACE today

## 2016-08-08 NOTE — Progress Notes (Signed)
Subjective:    Patient ID: William [name], male    DOB: 01/31/1951, 66 y.o.   MRN: [id number]  C.C.:  Follow-up for Pulmonary Sarcoidosis.  HPI Pulmonary Sarcoidosis: Initially diagnosed by bronchoscopy in March 2015. Patient also steroids since 2015. He reports his breathing has been doing fine without any dyspnea. Previously was in the ED in January for a "heaviness" in his chest anteriorly. His discomfort resolved. He had no associated cough then or now. Denies any wheezing. Reports only rare palpitations. No syncope or near syncope. No diaphoresis. He reports he has never had pain like that before and none since. He has been referred to a Cardiologist. Reports he did see his Ophthalmologist a couple of months ago. She reports pain was not pleuritic in nature and resolved after aspirin and medication in the emergency department.  Review of Systems Denies any nausea, emesis, dysphagia or odynophagia. No fever or chills. No rashes or bruising.   No Known Allergies  Current Outpatient Prescriptions on File Prior to Visit  Medication Sig Dispense Refill  . acetaminophen (TYLENOL) 325 MG tablet Take 325 mg by mouth as needed for moderate pain.    . cholecalciferol (VITAMIN D) 400 units TABS tablet Take 400 Units by mouth daily.    . fluticasone (FLONASE) 50 MCG/ACT nasal spray Place 2 sprays into both nostrils as needed.      No current facility-administered medications on file prior to visit.     Past Medical History:  Diagnosis Date  . Hyperlipidemia   . Sarcoidosis ([place]) 08/2013   Diagnosed with bronchoscopy. Pulmonary Involvement only.     Past Surgical History:  Procedure Laterality Date  . CHOLECYSTECTOMY    . COLONOSCOPY    . VIDEO BRONCHOSCOPY Bilateral 09/14/2013   Procedure: VIDEO BRONCHOSCOPY WITH FLUORO;  Surgeon: Kathee Delton, MD;  Location: [facility];  Service: Cardiopulmonary;  Laterality: Bilateral;    Family History  Problem Relation Age of Onset  .  Allergies Father   . Heart disease Father   . Lung disease Neg Hx   . Rheumatologic disease Neg Hx     Social History   Social History  . Marital status: Married    Spouse name: N/A  . Number of children: N/A  . Years of education: N/A   Occupational History  . [employer]   Social History Main Topics  . Smoking status: Never Smoker  . Smokeless tobacco: Never Used  . Alcohol use No  . Drug use: No  . Sexual activity: Not Asked   Other Topics Concern  . None   Social History Narrative   Originally from [place]. Always lived in [place]. Previously traveled to [place]. No international travel. No pets currently. No bird, mold, or hot tub exposure. 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       Objective:   Physical Exam BP 102/62 (BP Location: Right Arm, Patient Position: Sitting, Cuff Size: Normal)   Pulse (!) 48   Ht 5\' 7"  (1.702 m)   Wt 175 lb 3.2 oz (79.5 kg)   SpO2 97%   BMI 27.44 kg/m   General:  Awake. Alert. No acute distress. .  Integument:  Warm & dry. No rash on exposed skin. No bruising on exposed skin. Extremities:  No cyanosis or clubbing.  HEENT:  Moist mucus membranes. No oral ulcers. No scleral injection or icterus. No nasal turbinate swelling. Cardiovascular:  Regular rate. No edema. Normal S1 & S2.  Pulmonary:  Good aeration & clear to auscultation bilaterally. Symmetric chest wall expansion. No accessory muscle use on room air . Abdomen: Soft. Normal bowel sounds. Nondistended. Grossly nontender. Musculoskeletal:  Normal bulk and tone. No joint deformity or effusion appreciated.  PFT 11/07/15: FVC 3.22 L (71%) FEV1 2.28 L (67%) FEV1/FVC 0.71 FEF 25-75 1.33 L (48%) negative bronchodilator response TLC 4.27 L (62%) RV 22% ERV 215% DLCO corrected 84%  IMAGING CXR PA/LAT 07/23/16 (personally reviewed by me): No focal opacity or mass. No pleural effusion. Heart normal in  size & mediastinum normal in contour.  CT CHEST W/O 01/04/14 (previously reviewed by me): No pleural effusion or thickening. Reduce size of left upper lobe consolidation/mass. Diffuse peri-bronchovascular nodularity. Right hilar lymph node measuring approximately 2 cm. No other nodule or mass appreciated. No pericardial effusion. Patulous esophagus.  CARDIAC EKG 07/23/16 (personally reviewed by me):  Sinus bradycardia with 57 bpm. No evidence of conduction delay. No suggestion of ischemia. QTc 433 ms. EKG 09/07/15 (previously reviewed by me): Sinus bradycardia at 50bpm. QTc 439 ms. No evidence of conduction abnormality or ischemia.  PATHOLOGY BRUSH LUL (09/14/13): Reactive bronchial epithelial cells with fragments of granuloma. No atypia or malignancy. BAL LUL (09/14/13): Rare atypical cells. Abundant histiocytes. Acute and chronic inflammation. FNA & TBBx LUL (09/14/13): Chronic nonnecrotizing granulomatous inflammation with granulomata. AFB, GMS, & PAS stains negative. No atypia or malignancy.  MICROBIOLOGY BAL LUL (09/14/13):  Oral Flora / AFB negative / Fungal negative   LABS 07/23/16 CBC: 4.3/15.3/44.1/185 Troponin I:  <0.03 BMP: 137/3.9/101/29/20/1.07/97/8.9  10/08/15 CBC: 4.1/15.3/45.8/146 BMP: 139/3.9/102/25/20/1.0/88/9.3 LFT: 4.2/7.4/0.9/77/19/15  09/14/13 BAL LUL:  WBC 34 (24% lymph, 4% eos, 41% neutro, 32% macro)  04/17/14 ACE:  36    Assessment & Plan:  66 y.o. male with known history of pulmonary sarcoidosis. Patient's chest pain described in January is certainly concerning for a possible cardiac etiology given his family history. He has no signs or symptoms that would suggest active sarcoidosis. His previous pulmonary function testing did show moderate restriction which is quite probably due to his underlying sarcoidosis. Without obvious active pulmonary sarcoidosis I do not feel that steroids or further immunosuppression is necessary. We will need to assess the remainder of his  labs and testing to document stability of his sarcoidosis and pulmonary function. I instructed the patient contact my office if he had any further breathing problems or questions before his next appointment.  1. Pulmonary Sarcoidosis: Checking hepatic function panel and angiotensin-converting enzyme level today. Repeat full pulmonary function testing and checking 6 minute walk test at next appointment. 2. Chest Pain: Patient has follow-up with cardiology. Defer to cardiology on further testing and investigation. 3. Health maintenance:  S/P Pneumovax March 2017 & Influenza vaccine October 2017 . 4. Follow-up: Return to clinic 3 months or sooner if needed.   Sonia Baller Ashok Cordia, M.D. [facility] Pulmonary & Critical Care Pager:  [phone] After 3pm or if no response, call [phone] 11:38 AM 08/08/16

## 2016-08-08 NOTE — Addendum Note (Signed)
Addended by: Tyson Dense on: 08/08/2016 12:07 PM   Modules accepted: Orders

## 2016-08-11 LAB — ANGIOTENSIN CONVERTING ENZYME: ANGIOTENSIN-CONVERTING ENZYME: 42 U/L (ref 9–67)

## 2016-08-14 ENCOUNTER — Encounter: Payer: Self-pay | Admitting: Cardiovascular Disease

## 2016-08-14 ENCOUNTER — Encounter: Payer: Self-pay | Admitting: *Deleted

## 2016-08-14 ENCOUNTER — Ambulatory Visit (INDEPENDENT_AMBULATORY_CARE_PROVIDER_SITE_OTHER): Payer: BLUE CROSS/BLUE SHIELD | Admitting: Cardiovascular Disease

## 2016-08-14 VITALS — BP 122/84 | HR 59 | Ht 67.0 in | Wt 173.0 lb

## 2016-08-14 DIAGNOSIS — R079 Chest pain, unspecified: Secondary | ICD-10-CM | POA: Diagnosis not present

## 2016-08-14 DIAGNOSIS — R5383 Other fatigue: Secondary | ICD-10-CM | POA: Diagnosis not present

## 2016-08-14 DIAGNOSIS — Z9289 Personal history of other medical treatment: Secondary | ICD-10-CM

## 2016-08-14 DIAGNOSIS — Z8249 Family history of ischemic heart disease and other diseases of the circulatory system: Secondary | ICD-10-CM

## 2016-08-14 NOTE — Addendum Note (Signed)
Addended by: Laurine Blazer on: 08/14/2016 11:12 AM   Modules accepted: Orders

## 2016-08-14 NOTE — Patient Instructions (Signed)
Medication Instructions:  Continue all current medications.  Labwork: none  Testing/Procedures:  Your physician has requested that you have a stress echocardiogram. For further information please visit [email or web address]. Please follow instruction sheet as given.  Office will contact with results via phone or letter.    Follow-Up: 6 weeks   Any Other Special Instructions Will Be Listed Below (If Applicable).  If you need a refill on your cardiac medications before your next appointment, please call your pharmacy.  

## 2016-08-14 NOTE — Progress Notes (Signed)
CARDIOLOGY CONSULT NOTE  Patient ID: William [name] MRN: [id number] DOB/AGE: Sep 10, 1950 66 y.o.  Admit date: (Not on file) Primary Physician: Kenn File, MD Referring Physician:   Reason for Consultation: chest pain  HPI: 66 year old male referred for evaluation of chest pain. He has a history of sarcoidosis and hyperlipidemia. He was evaluated in the ED on 07/23/16. At that time it was occurring for a few minutes and up to an hour. He was sitting when it began. He had no associated shortness of breath, diaphoresis, or dizziness. Telemetry showed heart rate occasionally dipping down into the 40s but the patient was asymptomatic. No history of syncope. Basic metabolic panel, CBC, and 2 sets of troponins were normal. Chest x-ray showed no acute cardiopulmonary abnormalities with mild chronic bronchitic changes. ECG showed sinus rhythm with no ischemic ST segment or T-wave abnormalities, nor any arrhythmias.  He tells me the chest tightness actually occurred while he was working. It was located in the lower sternal region and lasted for about an hour. He was given aspirin by a nurse at work and his pain resolved. He had no associated shortness of breath, nausea, vomiting, or dizziness. He has not had chest pain prior to that day nor since. He has had fatigue for the past several weeks preceding the ER visit.  Also complains of right hand numbness with fingers turning blue when he showers.  FamHx: Father had MI in 54's, also had hypertension.    No Known Allergies  Current Outpatient Prescriptions  Medication Sig Dispense Refill  . acetaminophen (TYLENOL) 325 MG tablet Take 325 mg by mouth as needed for moderate pain.    . cholecalciferol (VITAMIN D) 400 units TABS tablet Take 400 Units by mouth daily.    . fluticasone (FLONASE) 50 MCG/ACT nasal spray Place 2 sprays into both nostrils as needed.      No current facility-administered medications for this visit.     Past  Medical History:  Diagnosis Date  . Hyperlipidemia   . Sarcoidosis ([place]) 08/2013   Diagnosed with bronchoscopy. Pulmonary Involvement only.     Past Surgical History:  Procedure Laterality Date  . CHOLECYSTECTOMY    . COLONOSCOPY    . VIDEO BRONCHOSCOPY Bilateral 09/14/2013   Procedure: VIDEO BRONCHOSCOPY WITH FLUORO;  Surgeon: Kathee Delton, MD;  Location: [facility];  Service: Cardiopulmonary;  Laterality: Bilateral;    Social History   Social History  . Marital status: Married    Spouse name: N/A  . Number of children: N/A  . Years of education: N/A   Occupational History  . [employer]   Social History Main Topics  . Smoking status: Never Smoker  . Smokeless tobacco: Never Used  . Alcohol use No  . Drug use: No  . Sexual activity: Not on file   Other Topics Concern  . Not on file   Social History Narrative   Originally from [place]. Always lived in [place]. Previously traveled to [place]. No international travel. No pets currently. No bird, mold, or hot tub exposure. 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      No family history of premature CAD in 1st degree relatives.  Prior to Admission medications   Medication Sig Start Date End Date Taking? Authorizing Provider  acetaminophen (TYLENOL) 325 MG tablet Take 325 mg by mouth as needed for moderate pain.   Yes Historical Provider,  MD  cholecalciferol (VITAMIN D) 400 units TABS tablet Take 400 Units by mouth daily.   Yes Historical Provider, MD  fluticasone (FLONASE) 50 MCG/ACT nasal spray Place 2 sprays into both nostrils as needed.  08/01/13  Yes Historical Provider, MD     Review of systems complete and found to be negative unless listed above in HPI     Physical exam Blood pressure 122/84, pulse (!) 59, height 5\' 7"  (1.702 m), weight 173 lb (78.5 kg), SpO2 98 %. General: NAD Neck: No JVD, no thyromegaly or thyroid nodule.    Lungs: Clear to auscultation bilaterally with normal respiratory effort. CV: Nondisplaced PMI. Regular rate and rhythm, normal S1/S2, no S3/S4, no murmur.  No peripheral edema.  No carotid bruit.   Abdomen: Soft, nontender, no hepatosplenomegaly, no distention.  Skin: Intact without lesions or rashes.  Neurologic: Alert and oriented x 3.  Psych: Normal affect. Extremities: No clubbing or cyanosis.  HEENT: Normal.   ECG: Most recent ECG reviewed.  Labs:   Lab Results  Component Value Date   WBC 4.3 07/23/2016   HGB 15.3 07/23/2016   HCT 44.1 07/23/2016   MCV 90.7 07/23/2016   PLT 185 07/23/2016    Recent Labs Lab 08/08/16 1219  PROT 7.3  BILITOT 0.6  ALKPHOS 68  ALT 16  AST 17   Lab Results  Component Value Date   TROPONINI <0.03 07/23/2016   No results found for: CHOL No results found for: HDL No results found for: LDLCALC No results found for: TRIG No results found for: CHOLHDL No results found for: LDLDIRECT       Studies: No results found.  ASSESSMENT AND PLAN:  1. Chest pain and fatigue: Will arrange for stress echocardiogram to evaluate for ischemic heart disease. CV risk factors include hyperlipidemia and family history of premature CAD.   Dispo: fu 6 wks   Signed: Kate Sable, M.D., F.A.C.C.  08/14/2016, 10:48 AM

## 2016-08-20 DIAGNOSIS — R972 Elevated prostate specific antigen [PSA]: Secondary | ICD-10-CM | POA: Diagnosis not present

## 2016-08-25 ENCOUNTER — Other Ambulatory Visit (HOSPITAL_COMMUNITY): Payer: BLUE CROSS/BLUE SHIELD

## 2016-08-27 ENCOUNTER — Ambulatory Visit (HOSPITAL_COMMUNITY)
Admission: RE | Admit: 2016-08-27 | Discharge: 2016-08-27 | Disposition: A | Discharge status: Home / Self Care | Payer: BLUE CROSS/BLUE SHIELD | Source: Ambulatory Visit | Attending: Cardiovascular Disease | Admitting: Cardiovascular Disease

## 2016-08-27 DIAGNOSIS — R079 Chest pain, unspecified: Secondary | ICD-10-CM | POA: Insufficient documentation

## 2016-08-27 LAB — ECHOCARDIOGRAM STRESS TEST
CHL CUP RESTING HR STRESS: 57 {beats}/min
CHL RATE OF PERCEIVED EXERTION: 13
CSEPED: 9 min
Estimated workload: 12.7 METS
Exercise duration (sec): 54 s
MPHR: 156 {beats}/min
Peak HR: 153 {beats}/min
Percent HR: 98 %

## 2016-08-27 NOTE — Progress Notes (Signed)
*  PRELIMINARY RESULTS* Echocardiogram Echocardiogram Stress Test has been performed.  Samuel Germany 08/27/2016, 12:54 PM

## 2016-08-29 ENCOUNTER — Encounter: Payer: Self-pay | Admitting: *Deleted

## 2016-09-01 ENCOUNTER — Ambulatory Visit: Payer: BLUE CROSS/BLUE SHIELD | Admitting: Pulmonary Disease

## 2016-09-24 ENCOUNTER — Encounter: Payer: Self-pay | Admitting: Cardiovascular Disease

## 2016-09-24 ENCOUNTER — Ambulatory Visit (INDEPENDENT_AMBULATORY_CARE_PROVIDER_SITE_OTHER): Payer: BLUE CROSS/BLUE SHIELD | Admitting: Cardiovascular Disease

## 2016-09-24 VITALS — BP 112/71 | HR 50 | Ht 67.0 in | Wt 171.8 lb

## 2016-09-24 DIAGNOSIS — R079 Chest pain, unspecified: Secondary | ICD-10-CM | POA: Diagnosis not present

## 2016-09-24 DIAGNOSIS — Z136 Encounter for screening for cardiovascular disorders: Secondary | ICD-10-CM

## 2016-09-24 DIAGNOSIS — Z8249 Family history of ischemic heart disease and other diseases of the circulatory system: Secondary | ICD-10-CM

## 2016-09-24 DIAGNOSIS — R5383 Other fatigue: Secondary | ICD-10-CM

## 2016-09-24 DIAGNOSIS — IMO0001 Reserved for inherently not codable concepts without codable children: Secondary | ICD-10-CM

## 2016-09-24 NOTE — Patient Instructions (Signed)
Your physician recommends that you schedule a follow-up appointment in: AS NEEDED WITH DR KONESWARAN  Your physician recommends that you continue on your current medications as directed. Please refer to the Current Medication list given to you today.  Thank you for choosing [facility]!!    

## 2016-09-24 NOTE — Progress Notes (Signed)
      SUBJECTIVE: The patient returns for follow-up after undergoing cardiovascular testing performed for the evaluation of chest pain. He underwent a normal stress echocardiogram on 08/27/16. He exercised for 10 minutes with a Duke treadmill score of 10 predicting a low risk of cardiac events.  He has had no recurrence of chest pain.   Review of Systems: As per "subjective", otherwise negative.  No Known Allergies  Current Outpatient Prescriptions  Medication Sig Dispense Refill  . acetaminophen (TYLENOL) 325 MG tablet Take 325 mg by mouth as needed for moderate pain.    . cholecalciferol (VITAMIN D) 400 units TABS tablet Take 400 Units by mouth daily.    . fluticasone (FLONASE) 50 MCG/ACT nasal spray Place 2 sprays into both nostrils as needed.      No current facility-administered medications for this visit.     Past Medical History:  Diagnosis Date  . Hyperlipidemia   . Sarcoidosis ([place]) 08/2013   Diagnosed with bronchoscopy. Pulmonary Involvement only.     Past Surgical History:  Procedure Laterality Date  . CHOLECYSTECTOMY    . COLONOSCOPY    . VIDEO BRONCHOSCOPY Bilateral 09/14/2013   Procedure: VIDEO BRONCHOSCOPY WITH FLUORO;  Surgeon: Kathee Delton, MD;  Location: [facility];  Service: Cardiopulmonary;  Laterality: Bilateral;    Social History   Social History  . Marital status: Married    Spouse name: N/A  . Number of children: N/A  . Years of education: N/A   Occupational History  . [employer]   Social History Main Topics  . Smoking status: Never Smoker  . Smokeless tobacco: Never Used  . Alcohol use No  . Drug use: No  . Sexual activity: Not on file   Other Topics Concern  . Not on file   Social History Narrative   Originally from [place]. Always lived in [place]. Previously traveled to [place]. No international travel. No pets currently. No bird, mold, or hot tub exposure. 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  Previously has works as his Forensic psychologist.      Vitals:   09/24/16 0855  BP: 112/71  Pulse: (!) 50  SpO2: 97%  Weight: 171 lb 12.8 oz (77.9 kg)  Height: 5\' 7"  (1.702 m)    PHYSICAL EXAM General: NAD HEENT: Normal. Neck: No JVD, no thyromegaly. Lungs: Clear to auscultation bilaterally with normal respiratory effort. CV: Nondisplaced PMI.  Regular rate and rhythm, normal S1/S2, no S3/S4, no murmur. No pretibial or periankle edema.    Abdomen: Soft, nontender, no distention.  Neurologic: Alert and oriented.  Psych: Normal affect. Skin: Normal. Musculoskeletal: No gross deformities.    ECG: Most recent ECG reviewed.      ASSESSMENT AND PLAN: 1. Chest pain: No recurrences. Stress echocardiogram was normal. Low risk Duke treadmill score of 10 predicts a low risk for future cardiac events. No further cardiovascular investigations are needed at this time. I encouraged him to call me should he have a recurrence of symptoms.  Dispo: fu prn.   Kate Sable, M.D., F.A.C.C.

## 2016-10-06 DIAGNOSIS — D869 Sarcoidosis, unspecified: Secondary | ICD-10-CM | POA: Diagnosis not present

## 2016-10-06 DIAGNOSIS — R972 Elevated prostate specific antigen [PSA]: Secondary | ICD-10-CM | POA: Diagnosis not present

## 2016-10-06 DIAGNOSIS — Z719 Counseling, unspecified: Secondary | ICD-10-CM | POA: Diagnosis not present

## 2016-10-06 DIAGNOSIS — E785 Hyperlipidemia, unspecified: Secondary | ICD-10-CM | POA: Diagnosis not present

## 2016-10-06 DIAGNOSIS — Z008 Encounter for other general examination: Secondary | ICD-10-CM | POA: Diagnosis not present

## 2016-11-10 ENCOUNTER — Ambulatory Visit: Payer: BLUE CROSS/BLUE SHIELD | Admitting: Pulmonary Disease

## 2016-11-14 ENCOUNTER — Encounter: Payer: Self-pay | Admitting: Pulmonary Disease

## 2016-11-14 ENCOUNTER — Ambulatory Visit (INDEPENDENT_AMBULATORY_CARE_PROVIDER_SITE_OTHER): Payer: BLUE CROSS/BLUE SHIELD | Admitting: Pulmonary Disease

## 2016-11-14 ENCOUNTER — Ambulatory Visit (INDEPENDENT_AMBULATORY_CARE_PROVIDER_SITE_OTHER): Payer: BLUE CROSS/BLUE SHIELD | Admitting: *Deleted

## 2016-11-14 VITALS — BP 110/70 | HR 59 | Ht 67.0 in | Wt 176.0 lb

## 2016-11-14 DIAGNOSIS — J984 Other disorders of lung: Secondary | ICD-10-CM

## 2016-11-14 DIAGNOSIS — D869 Sarcoidosis, unspecified: Secondary | ICD-10-CM

## 2016-11-14 DIAGNOSIS — J302 Other seasonal allergic rhinitis: Secondary | ICD-10-CM

## 2016-11-14 DIAGNOSIS — Z23 Encounter for immunization: Secondary | ICD-10-CM | POA: Diagnosis not present

## 2016-11-14 LAB — PULMONARY FUNCTION TEST
DL/VA % pred: 143 %
DL/VA: 6.53 ml/min/mmHg/L
DLCO COR % PRED: 72 %
DLCO UNC: 22.82 ml/min/mmHg
DLCO cor: 22.33 ml/min/mmHg
DLCO unc % pred: 73 %
FEF 25-75 POST: 1.45 L/s
FEF 25-75 PRE: 1.19 L/s
FEF2575-%Change-Post: 21 %
FEF2575-%Pred-Post: 54 %
FEF2575-%Pred-Pre: 44 %
FEV1-%Change-Post: 5 %
FEV1-%PRED-POST: 65 %
FEV1-%Pred-Pre: 62 %
FEV1-POST: 2.18 L
FEV1-Pre: 2.07 L
FEV1FVC-%Change-Post: 7 %
FEV1FVC-%PRED-PRE: 92 %
FEV6-%CHANGE-POST: -1 %
FEV6-%PRED-POST: 68 %
FEV6-%Pred-Pre: 69 %
FEV6-POST: 2.91 L
FEV6-Pre: 2.95 L
FEV6FVC-%CHANGE-POST: 0 %
FEV6FVC-%PRED-POST: 105 %
FEV6FVC-%Pred-Pre: 104 %
FVC-%CHANGE-POST: -2 %
FVC-%Pred-Post: 65 %
FVC-%Pred-Pre: 66 %
FVC-Post: 2.91 L
FVC-Pre: 2.98 L
PRE FEV1/FVC RATIO: 70 %
PRE FEV6/FVC RATIO: 99 %
Post FEV1/FVC ratio: 75 %
Post FEV6/FVC ratio: 100 %
RV % pred: 92 %
RV: 2.11 L
TLC % PRED: 76 %
TLC: 5.2 L

## 2016-11-14 NOTE — Progress Notes (Signed)
Subjective:    Patient ID: William [name], male    DOB: 05-Oct-1950, 66 y.o.   MRN: [id number]  C.C.:  Follow-up for Pulmonary Sarcoidosis & Chronic Seasonal Allergic Rhinitis.  HPI Pulmonary sarcoidosis:  Stage II. Diagnosed by bronchoscopy March 2015. Pulmonary Sarcoidosis: Initially diagnosed by bronchoscopy in March 2015. No steroids since 2015. Patient also steroids since 2015. He denies any dyspnea or coughing. No palpitations or near syncope. No syncope either. No focal weakness, numbness, or tingling. He reports his chest discomfort has resolved. She reports he is getting yearly eye exams by ophthalmology.  Chronic seasonal allergic rhinitis:  Denies any sinus congestion or drainage. Continuing to use Flonase.   Review of Systems No rashes or abnormal bruising. No eye swelling, pain, or erythema. No abdominal pain, nausea, or emesis.   No Known Allergies  Current Outpatient Prescriptions on File Prior to Visit  Medication Sig Dispense Refill  . acetaminophen (TYLENOL) 325 MG tablet Take 325 mg by mouth as needed for moderate pain.    . cholecalciferol (VITAMIN D) 400 units TABS tablet Take 400 Units by mouth daily.    . fluticasone (FLONASE) 50 MCG/ACT nasal spray Place 2 sprays into both nostrils as needed.      No current facility-administered medications on file prior to visit.     Past Medical History:  Diagnosis Date  . Hyperlipidemia   . Sarcoidosis 08/2013   Diagnosed with bronchoscopy. Pulmonary Involvement only.     Past Surgical History:  Procedure Laterality Date  . CHOLECYSTECTOMY    . COLONOSCOPY    . VIDEO BRONCHOSCOPY Bilateral 09/14/2013   Procedure: VIDEO BRONCHOSCOPY WITH FLUORO;  Surgeon: Kathee Delton, MD;  Location: [facility];  Service: Cardiopulmonary;  Laterality: Bilateral;    Family History  Problem Relation Age of Onset  . Allergies Father   . Heart disease Father   . Lung disease Neg Hx   . Rheumatologic disease Neg Hx     Social  History   Social History  . Marital status: Married    Spouse name: N/A  . Number of children: N/A  . Years of education: N/A   Occupational History  . [employer]   Social History Main Topics  . Smoking status: Never Smoker  . Smokeless tobacco: Never Used  . Alcohol use No  . Drug use: No  . Sexual activity: Not Asked   Other Topics Concern  . None   Social History Narrative   Originally from [place]. Always lived in [place]. Previously traveled to [place]. No international travel. No pets currently. No bird, mold, or hot tub exposure. 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       Objective:   Physical Exam BP 110/70 (BP Location: Right Arm, Patient Position: Sitting, Cuff Size: Normal)   Pulse (!) 59   Ht 5\' 7"  (1.702 m)   Wt 176 lb (79.8 kg)   SpO2 99%   BMI 27.57 kg/m   Gen.: Comfortable. Caucasian male. No distress. Integument: Warm. Dry. No rash. Cardiovascular: Regular rate. Regular rhythm. No edema. Pulmonary: Clear to auscultation. Normal work of breathing on room air. Abdomen: Soft. Mildly protuberant. Normal bowel sounds. Musculoskeletal: No joint deformity or effusion. Hand grip 5/5 bilaterally. Neurological: Cranial nerves grossly intact. No meningismus. Symmetric brachioradialis reflexes. HEENT: Moist mucous membranes. Minimal nasal turbinate swelling. No oral ulcers.  PFT 11/14/16: FVC 2.98 L (66%) FEV1 2.07  L (62%) FEV1/FVC 0.70 FEF 25-75 1.19 L (44%) negative bronchodilator response TLC 5.20 L (76%) RV 92% ERV 116% DLCO corrected 72% 11/07/15: FVC 3.22 L (71%) FEV1 2.28 L (67%) FEV1/FVC 0.71 FEF 25-75 1.33 L (48%) negative bronchodilator response TLC 4.27 L (62%) RV 22% ERV 215% DLCO corrected 84%  6MWT 11/14/16:  Walked 440 meters / Baseline Sat 100% on RA / Nadir Sat 99% on RA @ end of test  IMAGING CXR PA/LAT 07/23/16 (previously reviewed by me): No focal opacity or  mass. No pleural effusion. Heart normal in size & mediastinum normal in contour.  CT CHEST W/O 01/04/14 (previously reviewed by me): No pleural effusion or thickening. Reduce size of left upper lobe consolidation/mass. Diffuse peri-bronchovascular nodularity. Right hilar lymph node measuring approximately 2 cm. No other nodule or mass appreciated. No pericardial effusion. Patulous esophagus.  CARDIAC EKG 07/23/16 (previously reviewed by me):  Sinus bradycardia with 57 bpm. No evidence of conduction delay. No suggestion of ischemia. QTc 433 ms.  EKG 09/07/15 (previously reviewed by me): Sinus bradycardia at 50bpm. QTc 439 ms. No evidence of conduction abnormality or ischemia.  PATHOLOGY BRUSH LUL (09/14/13): Reactive bronchial epithelial cells with fragments of granuloma. No atypia or malignancy. BAL LUL (09/14/13): Rare atypical cells. Abundant histiocytes. Acute and chronic inflammation. FNA & TBBx LUL (09/14/13): Chronic nonnecrotizing granulomatous inflammation with granulomata. AFB, GMS, & PAS stains negative. No atypia or malignancy.  MICROBIOLOGY BAL LUL (09/14/13):  Oral Flora / AFB negative / Fungal negative   LABS 08/08/16 ACE:  42 LFT: 4.2/7.3/0.6/68/17/16  07/23/16 CBC: 4.3/15.3/44.1/185 Troponin I:  <0.03 BMP: 137/3.9/101/29/20/1.07/97/8.9  10/08/15 CBC: 4.1/15.3/45.8/146 BMP: 139/3.9/102/25/20/1.0/88/9.3 LFT: 4.2/7.4/0.9/77/19/15  09/14/13 BAL LUL:  WBC 34 (24% lymph, 4% eos, 41% neutro, 32% macro)  04/17/14 ACE:  36    Assessment & Plan:  66 y.o. male with pulmonary sarcoidosis & mild restrictive lung disease. Restrictive lung disease appears to be mildly improved compared with previous testing despite significant reduction in spirometry. The patient exhibits no significant desaturation with a good walk test distance. I do not believe that his sarcoidosis is active at this time. We did discuss his chronic seasonal allergic rhinitis which seems to be well controlled with  Flonase alone. I do not believe further testing is necessary with his restrictive lung disease nor do I believe that treatment for his underlying pulmonary sarcoidosis is necessary. I instructed the patient to notify my office if he had any new breathing problems or questions before his next appointment.  1. Pulmonary sarcoidosis:  Continuing yearly monitoring with EKG, LFTs, CBC, BMP, full pulmonary function testing, and yearly ophthalmologic exams. Holding on treatment. Patient to notify me for any new symptoms. 2. Mild restrictive lung disease:  Likely secondary to parenchymal sarcoidosis. No further testing. Repeat full PFTs in a year.  3. Chronic seasonal allergic rhinitis:  Continuing Flonase. No changes.  4. Health maintenance: Status post Pneumovax March 2017 & Influenza vaccine October 2017. Administering Prevnar vaccine today.  5. Follow-up: Return to clinic in 1 year or sooner if needed.   Sonia Baller Ashok Cordia, M.D. [facility] Pulmonary & Critical Care Pager:  [phone] After 3pm or if no response, call [phone] 11:24 AM 11/14/16

## 2016-11-14 NOTE — Patient Instructions (Addendum)
   Call me if you have any new breathing problems or questions before your next appointment  We will repeat a breathing test in 1 year at your next appoitnment  TESTS ORDERED: 1. Full PFTs at follow-up appointment

## 2016-11-14 NOTE — Progress Notes (Signed)
SIX MIN WALK 11/14/2016  Medications no meds taken today  Supplimental Oxygen during Test? (L/min) No  Laps 9  Partial Lap (in Meters) 8  Baseline BP (sitting) 124/66  Baseline Heartrate 60  Baseline Dyspnea (Borg Scale) 0  Baseline Fatigue (Borg Scale) 0  Baseline SPO2 100  BP (sitting) 128/70  Heartrate 85  Dyspnea (Borg Scale) 1  Fatigue (Borg Scale) 2  SPO2 99  BP (sitting) 126/68  Heartrate 62  SPO2 100  Stopped or Paused before Six Minutes No  Distance Completed 440  Tech Comments: test performed with forehead probe. pt completed test with no desats or complaints. pt walked at moderate pace.

## 2016-11-14 NOTE — Progress Notes (Signed)
PFT done today. 

## 2017-01-12 DIAGNOSIS — Z008 Encounter for other general examination: Secondary | ICD-10-CM | POA: Diagnosis not present

## 2017-01-12 DIAGNOSIS — E785 Hyperlipidemia, unspecified: Secondary | ICD-10-CM | POA: Diagnosis not present

## 2017-01-12 DIAGNOSIS — D869 Sarcoidosis, unspecified: Secondary | ICD-10-CM | POA: Diagnosis not present

## 2017-01-12 DIAGNOSIS — Z719 Counseling, unspecified: Secondary | ICD-10-CM | POA: Diagnosis not present

## 2017-02-11 DIAGNOSIS — R972 Elevated prostate specific antigen [PSA]: Secondary | ICD-10-CM | POA: Diagnosis not present

## 2017-07-31 DIAGNOSIS — E786 Lipoprotein deficiency: Secondary | ICD-10-CM | POA: Diagnosis not present

## 2017-07-31 DIAGNOSIS — R972 Elevated prostate specific antigen [PSA]: Secondary | ICD-10-CM | POA: Diagnosis not present

## 2017-07-31 DIAGNOSIS — D869 Sarcoidosis, unspecified: Secondary | ICD-10-CM | POA: Diagnosis not present

## 2017-07-31 DIAGNOSIS — Z008 Encounter for other general examination: Secondary | ICD-10-CM | POA: Diagnosis not present

## 2017-08-14 DIAGNOSIS — L219 Seborrheic dermatitis, unspecified: Secondary | ICD-10-CM | POA: Diagnosis not present

## 2017-08-14 DIAGNOSIS — D869 Sarcoidosis, unspecified: Secondary | ICD-10-CM | POA: Diagnosis not present

## 2017-08-27 DIAGNOSIS — R972 Elevated prostate specific antigen [PSA]: Secondary | ICD-10-CM | POA: Diagnosis not present

## 2017-09-09 DIAGNOSIS — R972 Elevated prostate specific antigen [PSA]: Secondary | ICD-10-CM | POA: Diagnosis not present

## 2017-10-02 ENCOUNTER — Encounter: Payer: Self-pay | Admitting: Family Medicine

## 2017-10-02 ENCOUNTER — Ambulatory Visit: Payer: BLUE CROSS/BLUE SHIELD | Admitting: Family Medicine

## 2017-10-02 VITALS — BP 122/72 | HR 72 | Temp 96.9°F | Ht 67.0 in | Wt 182.4 lb

## 2017-10-02 DIAGNOSIS — J0101 Acute recurrent maxillary sinusitis: Secondary | ICD-10-CM

## 2017-10-02 MED ORDER — AMOXICILLIN-POT CLAVULANATE 875-125 MG PO TABS: 1.0000 | ORAL_TABLET | Freq: Two times a day (BID) | ORAL | 0 refills | Status: DC

## 2017-10-02 NOTE — Patient Instructions (Signed)
Great to see you!   Sinusitis, Adult Sinusitis is soreness and inflammation of your sinuses. Sinuses are hollow spaces in the bones around your face. They are located:  Around your eyes.  In the middle of your forehead.  Behind your nose.  In your cheekbones.  Your sinuses and nasal passages are lined with a stringy fluid (mucus). Mucus normally drains out of your sinuses. When your nasal tissues get inflamed or swollen, the mucus can get trapped or blocked so air cannot flow through your sinuses. This lets bacteria, viruses, and funguses grow, and that leads to infection. Follow these instructions at home: Medicines  Take, use, or apply over-the-counter and prescription medicines only as told by your doctor. These may include nasal sprays.  If you were prescribed an antibiotic medicine, take it as told by your doctor. Do not stop taking the antibiotic even if you start to feel better. Hydrate and Humidify  Drink enough water to keep your pee (urine) clear or pale yellow.  Use a cool mist humidifier to keep the humidity level in your home above 50%.  Breathe in steam for 10-15 minutes, 3-4 times a day or as told by your doctor. You can do this in the bathroom while a hot shower is running.  Try not to spend time in cool or dry air. Rest  Rest as much as possible.  Sleep with your head raised (elevated).  Make sure to get enough sleep each night. General instructions  Put a warm, moist washcloth on your face 3-4 times a day or as told by your doctor. This will help with discomfort.  Wash your hands often with soap and water. If there is no soap and water, use hand sanitizer.  Do not smoke. Avoid being around people who are smoking (secondhand smoke).  Keep all follow-up visits as told by your doctor. This is important. Contact a doctor if:  You have a fever.  Your symptoms get worse.  Your symptoms do not get better within 10 days. Get help right away if:  You  have a very bad headache.  You cannot stop throwing up (vomiting).  You have pain or swelling around your face or eyes.  You have trouble seeing.  You feel confused.  Your neck is stiff.  You have trouble breathing. This information is not intended to replace advice given to you by your health care provider. Make sure you discuss any questions you have with your health care provider. Document Released: 12/03/2007 Document Revised: 02/10/2016 Document Reviewed: 04/11/2015 Elsevier Interactive Patient Education  2018 Elsevier Inc.  

## 2017-10-02 NOTE — Progress Notes (Signed)
   HPI  Patient presents today here for concern of sinus infection.  Patient reports history of sarcoidosis and he is concerned that his illness may cause a flare of that.  Patient reports 1 to 2 days of right-sided maxillary sinus pressure and pain. He denies cough, congestion, or headache.  Is tolerating food and fluids like usual.   PMH: Smoking status noted ROS: Per HPI  Objective: BP 122/72   Pulse 72   Temp (!) 96.9 F (36.1 C) (Oral)   Ht 5\' 7"  (1.702 m)   Wt 182 lb 6.4 oz (82.7 kg)   BMI 28.57 kg/m  Gen: NAD, alert, cooperative with exam HEENT: NCAT, Tms WNL, + R sided Sinus TTP over the maxilary sinus CV: RRR, good S1/S2, no murmur Resp: CTABL, no wheezes, non-labored Ext: No edema, warm Neuro: Alert and oriented, No gross deficits  Assessment and plan:  #Maxillary sinusitis Treat with Augmentin Discussed supportive care, return to clinic with any concerns    Meds ordered this encounter  Medications  . amoxicillin-clavulanate (AUGMENTIN) 875-125 MG tablet    Sig: Take 1 tablet by mouth 2 (two) times daily.    Dispense:  20 tablet    Refill:  0    Laroy Apple, MD [facility] 10/02/2017, 1:11 PM

## 2017-11-17 ENCOUNTER — Other Ambulatory Visit: Payer: Self-pay | Admitting: Pulmonary Disease

## 2017-11-17 DIAGNOSIS — D860 Sarcoidosis of lung: Secondary | ICD-10-CM

## 2017-11-18 ENCOUNTER — Ambulatory Visit (INDEPENDENT_AMBULATORY_CARE_PROVIDER_SITE_OTHER): Payer: BLUE CROSS/BLUE SHIELD | Admitting: Pulmonary Disease

## 2017-11-18 ENCOUNTER — Ambulatory Visit: Payer: BLUE CROSS/BLUE SHIELD | Admitting: Pulmonary Disease

## 2017-11-18 ENCOUNTER — Encounter: Payer: Self-pay | Admitting: Pulmonary Disease

## 2017-11-18 ENCOUNTER — Ambulatory Visit (INDEPENDENT_AMBULATORY_CARE_PROVIDER_SITE_OTHER)
Admission: RE | Admit: 2017-11-18 | Discharge: 2017-11-18 | Disposition: A | Discharge status: Home / Self Care | Payer: BLUE CROSS/BLUE SHIELD | Source: Ambulatory Visit | Attending: Pulmonary Disease | Admitting: Pulmonary Disease

## 2017-11-18 DIAGNOSIS — J984 Other disorders of lung: Secondary | ICD-10-CM | POA: Diagnosis not present

## 2017-11-18 DIAGNOSIS — D860 Sarcoidosis of lung: Secondary | ICD-10-CM

## 2017-11-18 LAB — PULMONARY FUNCTION TEST
DL/VA % pred: 122 %
DL/VA: 5.57 ml/min/mmHg/L
DLCO UNC % PRED: 76 %
DLCO UNC: 23.76 ml/min/mmHg
FEF 25-75 Post: 1.15 L/sec
FEF 25-75 Pre: 1.71 L/sec
FEF2575-%Change-Post: -32 %
FEF2575-%PRED-POST: 43 %
FEF2575-%Pred-Pre: 65 %
FEV1-%Change-Post: -8 %
FEV1-%PRED-POST: 60 %
FEV1-%Pred-Pre: 65 %
FEV1-POST: 2 L
FEV1-Pre: 2.18 L
FEV1FVC-%CHANGE-POST: -1 %
FEV1FVC-%PRED-PRE: 101 %
FEV6-%CHANGE-POST: -6 %
FEV6-%Pred-Post: 63 %
FEV6-%Pred-Pre: 67 %
FEV6-Post: 2.66 L
FEV6-Pre: 2.85 L
FEV6FVC-%PRED-POST: 105 %
FEV6FVC-%Pred-Pre: 105 %
FVC-%Change-Post: -6 %
FVC-%Pred-Post: 60 %
FVC-%Pred-Pre: 64 %
FVC-Post: 2.66 L
FVC-Pre: 2.85 L
PRE FEV1/FVC RATIO: 76 %
PRE FEV6/FVC RATIO: 100 %
Post FEV1/FVC ratio: 75 %
Post FEV6/FVC ratio: 100 %
RV % PRED: 49 %
RV: 1.13 L
TLC % pred: 83 %
TLC: 5.69 L

## 2017-11-18 NOTE — Patient Instructions (Signed)
Chest x-ray today. Sarcoidosis appears stable

## 2017-11-18 NOTE — Progress Notes (Signed)
   Subjective:    Patient ID: William [name], male    DOB: [birth year], 67 y.o.   MRN: [id number]  HPI  67 year old never smoker with sarcoidosis presents to establish care and for annual follow-up. He was diagnosed in 2015 when he presented with masslike consolidation in the left upper lobe and hilar lymphadenopathy and underwent bronchoscopic biopsy.  He denies cough, wheezing or frequent chest colds.  He has had an uneventful year. No skin lesions, no eye problems   Significant tests/ events reviewed  PFT 10/2017 PFT >> ratio 76, FEV1 65%, FVC 64%, TLC 83%, DLCO 76%  11/14/16: FVC 2.98 L (66%) FEV1 2.07 L (62%) FEV1/FVC 0.70  negative bronchodilator response TLC 5.20 L (76%) RV 92% ERV 116% DLCO corrected 72%  11/07/15: FVC 3.22 L (71%) FEV1 2.28 L (67%) FEV1/FVC 0.71  negative bronchodilator response TLC 4.27 L (62%) RV 22% ERV 215% DLCO corrected 84%   CT CHEST W/O 01/04/14: No pleural effusion or thickening. Reduce size of left upper lobe consolidation/mass. Diffuse peri-bronchovascular nodularity. Right hilar lymph node measuring approximately 2 cm. No other nodule or mass appreciated. No pericardial effusion. Patulous esophagus.   PATHOLOGY BRUSH LUL (09/14/13): Reactive bronchial epithelial cells with fragments of granuloma. No atypia or malignancy. BAL LUL (09/14/13): Rare atypical cells. Abundant histiocytes. Acute and chronic inflammation. FNA & TBBx LUL (09/14/13): Chronic nonnecrotizing granulomatous inflammation with granulomata. AFB, GMS, & PAS stains negative. No atypia or malignancy.  Review of Systems Patient denies significant dyspnea,cough, hemoptysis,  chest pain, palpitations, pedal edema, orthopnea, paroxysmal nocturnal dyspnea, lightheadedness, nausea, vomiting, abdominal or  leg pains      Objective:   Physical Exam  Gen. Pleasant, well-nourished, in no distress ENT - no thrush, no post nasal drip Neck: No JVD, no thyromegaly, no carotid bruits Lungs: no  use of accessory muscles, no dullness to percussion, clear without rales or rhonchi  Cardiovascular: Rhythm regular, heart sounds  normal, no murmurs or gallops, no peripheral edema Musculoskeletal: No deformities, no cyanosis or clubbing         Assessment & Plan:

## 2017-11-18 NOTE — Assessment & Plan Note (Signed)
PFTs again shows stable restriction

## 2017-11-18 NOTE — Progress Notes (Signed)
Patient completed full PFT today. 

## 2017-11-18 NOTE — Assessment & Plan Note (Signed)
Appears stable.  He briefly required prednisone in 2015 but has not since then.  Appears burned-out mostly by imaging and left upper lobe consolidation has regressed into scar tissue. We will obtain chest x-ray today. He will follow-up with [place] yearly

## 2017-11-18 NOTE — Addendum Note (Signed)
Addended by: Jannette Spanner on: 11/18/2017 11:33 AM   Modules accepted: Orders

## 2018-01-15 DIAGNOSIS — E559 Vitamin D deficiency, unspecified: Secondary | ICD-10-CM | POA: Diagnosis not present

## 2018-01-15 DIAGNOSIS — Z008 Encounter for other general examination: Secondary | ICD-10-CM | POA: Diagnosis not present

## 2018-01-15 DIAGNOSIS — E786 Lipoprotein deficiency: Secondary | ICD-10-CM | POA: Diagnosis not present

## 2018-01-15 DIAGNOSIS — R972 Elevated prostate specific antigen [PSA]: Secondary | ICD-10-CM | POA: Diagnosis not present

## 2018-03-23 DIAGNOSIS — Z23 Encounter for immunization: Secondary | ICD-10-CM | POA: Diagnosis not present

## 2018-04-06 ENCOUNTER — Emergency Department (HOSPITAL_COMMUNITY): Payer: BLUE CROSS/BLUE SHIELD | Admitting: Anesthesiology

## 2018-04-06 ENCOUNTER — Other Ambulatory Visit (HOSPITAL_COMMUNITY)
Admission: RE | Admit: 2018-04-06 | Discharge: 2018-04-06 | Disposition: A | Discharge status: Home / Self Care | Payer: BLUE CROSS/BLUE SHIELD | Source: Ambulatory Visit | Attending: Family Medicine | Admitting: Family Medicine

## 2018-04-06 ENCOUNTER — Ambulatory Visit: Payer: BLUE CROSS/BLUE SHIELD | Admitting: Family Medicine

## 2018-04-06 ENCOUNTER — Observation Stay (HOSPITAL_COMMUNITY)
Admission: EM | Admit: 2018-04-06 | Discharge: 2018-04-07 | Disposition: A | Discharge status: Home / Self Care | Payer: BLUE CROSS/BLUE SHIELD | Attending: General Surgery | Admitting: General Surgery

## 2018-04-06 ENCOUNTER — Encounter (HOSPITAL_COMMUNITY)
Admission: EM | Disposition: A | Discharge status: Home / Self Care | Payer: Self-pay | Source: Home / Self Care | Attending: Emergency Medicine

## 2018-04-06 ENCOUNTER — Other Ambulatory Visit: Payer: Self-pay

## 2018-04-06 ENCOUNTER — Encounter (HOSPITAL_COMMUNITY): Payer: Self-pay

## 2018-04-06 ENCOUNTER — Encounter: Payer: Self-pay | Admitting: Family Medicine

## 2018-04-06 ENCOUNTER — Ambulatory Visit (HOSPITAL_COMMUNITY)
Admission: RE | Admit: 2018-04-06 | Discharge: 2018-04-06 | Disposition: A | Discharge status: Home / Self Care | Payer: BLUE CROSS/BLUE SHIELD | Source: Ambulatory Visit | Attending: Family Medicine | Admitting: Family Medicine

## 2018-04-06 VITALS — BP 117/66 | HR 85 | Temp 97.2°F | Ht 68.0 in | Wt 173.6 lb

## 2018-04-06 DIAGNOSIS — K3580 Unspecified acute appendicitis: Secondary | ICD-10-CM | POA: Diagnosis not present

## 2018-04-06 DIAGNOSIS — K3533 Acute appendicitis with perforation and localized peritonitis, with abscess: Secondary | ICD-10-CM | POA: Diagnosis not present

## 2018-04-06 DIAGNOSIS — Z9049 Acquired absence of other specified parts of digestive tract: Secondary | ICD-10-CM

## 2018-04-06 DIAGNOSIS — N400 Enlarged prostate without lower urinary tract symptoms: Secondary | ICD-10-CM

## 2018-04-06 DIAGNOSIS — R1031 Right lower quadrant pain: Secondary | ICD-10-CM | POA: Diagnosis not present

## 2018-04-06 DIAGNOSIS — R109 Unspecified abdominal pain: Secondary | ICD-10-CM

## 2018-04-06 DIAGNOSIS — R1030 Lower abdominal pain, unspecified: Secondary | ICD-10-CM

## 2018-04-06 DIAGNOSIS — R9389 Abnormal findings on diagnostic imaging of other specified body structures: Secondary | ICD-10-CM | POA: Insufficient documentation

## 2018-04-06 DIAGNOSIS — D860 Sarcoidosis of lung: Secondary | ICD-10-CM | POA: Insufficient documentation

## 2018-04-06 DIAGNOSIS — Z7951 Long term (current) use of inhaled steroids: Secondary | ICD-10-CM | POA: Diagnosis not present

## 2018-04-06 HISTORY — PX: LAPAROSCOPIC APPENDECTOMY: SHX408

## 2018-04-06 LAB — COMPREHENSIVE METABOLIC PANEL
ALBUMIN: 4.5 g/dL (ref 3.5–5.0)
ALT: 19 U/L (ref 0–44)
AST: 23 U/L (ref 15–41)
Alkaline Phosphatase: 75 U/L (ref 38–126)
Anion gap: 8 (ref 5–15)
BUN: 14 mg/dL (ref 8–23)
CHLORIDE: 99 mmol/L (ref 98–111)
CO2: 27 mmol/L (ref 22–32)
CREATININE: 0.93 mg/dL (ref 0.61–1.24)
Calcium: 8.9 mg/dL (ref 8.9–10.3)
GFR calc Af Amer: >60 mL/min (ref >60)
GLUCOSE: 122 mg/dL — AB (ref 70–99)
POTASSIUM: 4.1 mmol/L (ref 3.5–5.1)
SODIUM: 134 mmol/L — AB (ref 135–145)
Total Bilirubin: 1.9 mg/dL — ABNORMAL HIGH (ref 0.3–1.2)
Total Protein: 8.4 g/dL — ABNORMAL HIGH (ref 6.5–8.1)

## 2018-04-06 LAB — CBC WITH DIFFERENTIAL/PLATELET
Abs Immature Granulocytes: 0.08 10*3/uL — ABNORMAL HIGH (ref 0.00–0.07)
Basophils Absolute: 0 10*3/uL (ref 0.0–0.1)
Basophils Relative: 0 %
EOS PCT: 0 %
Eosinophils Absolute: 0 10*3/uL (ref 0.0–0.5)
HEMATOCRIT: 48.6 % (ref 39.0–52.0)
HEMOGLOBIN: 15.6 g/dL (ref 13.0–17.0)
Immature Granulocytes: 1 %
LYMPHS ABS: 0.6 10*3/uL — AB (ref 0.7–4.0)
LYMPHS PCT: 4 %
MCH: 29.4 pg (ref 26.0–34.0)
MCHC: 32.1 g/dL (ref 30.0–36.0)
MCV: 91.7 fL (ref 80.0–100.0)
MONO ABS: 1.2 10*3/uL — AB (ref 0.1–1.0)
MONOS PCT: 8 %
NEUTROS ABS: 12.8 10*3/uL — AB (ref 1.7–7.7)
Neutrophils Relative %: 87 %
Platelets: 244 10*3/uL (ref 150–400)
RBC: 5.3 MIL/uL (ref 4.22–5.81)
RDW: 12.9 % (ref 11.5–15.5)
WBC: 14.8 10*3/uL — ABNORMAL HIGH (ref 4.0–10.5)
nRBC: 0 % (ref 0.0–0.2)

## 2018-04-06 LAB — URINALYSIS, COMPLETE
Bilirubin, UA: NEGATIVE
Glucose, UA: NEGATIVE
Ketones, UA: NEGATIVE
Nitrite, UA: NEGATIVE
PH UA: 6.5 (ref 5.0–7.5)
Protein, UA: NEGATIVE
RBC UA: NEGATIVE
SPEC GRAV UA: 1.015 (ref 1.005–1.030)
Urobilinogen, Ur: 1 mg/dL (ref 0.2–1.0)

## 2018-04-06 LAB — MICROSCOPIC EXAMINATION
BACTERIA UA: NONE SEEN
Epithelial Cells (non renal): NONE SEEN /hpf (ref 0–10)
RENAL EPITHEL UA: NONE SEEN /HPF

## 2018-04-06 SURGERY — APPENDECTOMY, LAPAROSCOPIC
Anesthesia: General | Site: Abdomen

## 2018-04-06 MED ORDER — DIPHENHYDRAMINE HCL 50 MG/ML IJ SOLN: 12.5000 mg | Freq: Four times a day (QID) | INTRAMUSCULAR | Status: DC | PRN

## 2018-04-06 MED ORDER — PROPOFOL 10 MG/ML IV BOLUS
INTRAVENOUS | Status: AC
  Filled 2018-04-06: qty 20

## 2018-04-06 MED ORDER — KETOROLAC TROMETHAMINE 15 MG/ML IJ SOLN: 15.0000 mg | Freq: Four times a day (QID) | INTRAMUSCULAR | Status: AC

## 2018-04-06 MED ORDER — CHLORHEXIDINE GLUCONATE CLOTH 2 % EX PADS: 6.0000 | MEDICATED_PAD | Freq: Once | CUTANEOUS | Status: DC

## 2018-04-06 MED ORDER — LACTATED RINGERS IV SOLN: INTRAVENOUS | Status: DC

## 2018-04-06 MED ORDER — PIPERACILLIN-TAZOBACTAM 3.375 G IVPB
3.3750 g | Freq: Three times a day (TID) | INTRAVENOUS | Status: DC
  Administered 2018-04-07: 3.375 g via INTRAVENOUS
  Filled 2018-04-06: qty 50

## 2018-04-06 MED ORDER — BUPIVACAINE LIPOSOME 1.3 % IJ SUSP
INTRAMUSCULAR | Status: DC | PRN
  Administered 2018-04-06: 20 mL

## 2018-04-06 MED ORDER — MIDAZOLAM HCL 2 MG/2ML IJ SOLN
INTRAMUSCULAR | Status: DC | PRN
  Administered 2018-04-06: 2 mg via INTRAVENOUS

## 2018-04-06 MED ORDER — HYDROCODONE-ACETAMINOPHEN 7.5-325 MG PO TABS: 1.0000 | ORAL_TABLET | Freq: Once | ORAL | Status: DC | PRN

## 2018-04-06 MED ORDER — HYDROMORPHONE HCL 1 MG/ML IJ SOLN: 1.0000 mg | INTRAMUSCULAR | Status: DC | PRN

## 2018-04-06 MED ORDER — MORPHINE SULFATE (PF) 4 MG/ML IV SOLN
4.0000 mg | INTRAVENOUS | Status: DC | PRN
  Administered 2018-04-06: 4 mg via INTRAVENOUS
  Filled 2018-04-06: qty 1

## 2018-04-06 MED ORDER — LIDOCAINE HCL (PF) 1 % IJ SOLN
INTRAMUSCULAR | Status: AC
  Filled 2018-04-06: qty 5

## 2018-04-06 MED ORDER — FLUTICASONE PROPIONATE 50 MCG/ACT NA SUSP: 2.0000 | Freq: Every day | NASAL | Status: DC | PRN

## 2018-04-06 MED ORDER — HYDROMORPHONE HCL 1 MG/ML IJ SOLN: 0.2500 mg | INTRAMUSCULAR | Status: DC | PRN

## 2018-04-06 MED ORDER — ACETAMINOPHEN 500 MG PO TABS
1000.0000 mg | ORAL_TABLET | Freq: Once | ORAL | Status: AC
  Administered 2018-04-06: 1000 mg via ORAL

## 2018-04-06 MED ORDER — SUGAMMADEX SODIUM 500 MG/5ML IV SOLN
INTRAVENOUS | Status: DC | PRN
  Administered 2018-04-06: 157.4 mg via INTRAVENOUS

## 2018-04-06 MED ORDER — HYDROCODONE-ACETAMINOPHEN 5-325 MG PO TABS
1.0000 | ORAL_TABLET | ORAL | Status: DC | PRN
  Administered 2018-04-07: 2 via ORAL
  Filled 2018-04-06: qty 2

## 2018-04-06 MED ORDER — FENTANYL CITRATE (PF) 250 MCG/5ML IJ SOLN
INTRAMUSCULAR | Status: AC
  Filled 2018-04-06: qty 5

## 2018-04-06 MED ORDER — MIDAZOLAM HCL 2 MG/2ML IJ SOLN: 0.5000 mg | Freq: Once | INTRAMUSCULAR | Status: DC | PRN

## 2018-04-06 MED ORDER — KETOROLAC TROMETHAMINE 30 MG/ML IJ SOLN
30.0000 mg | Freq: Once | INTRAMUSCULAR | Status: AC
  Administered 2018-04-06: 30 mg via INTRAVENOUS

## 2018-04-06 MED ORDER — ONDANSETRON HCL 4 MG/2ML IJ SOLN
4.0000 mg | INTRAMUSCULAR | Status: AC | PRN
  Administered 2018-04-06 (×2): 4 mg via INTRAVENOUS
  Filled 2018-04-06: qty 2

## 2018-04-06 MED ORDER — PIPERACILLIN-TAZOBACTAM 3.375 G IVPB 30 MIN
3.3750 g | Freq: Once | INTRAVENOUS | Status: AC
  Administered 2018-04-06: 3.375 g via INTRAVENOUS
  Filled 2018-04-06: qty 50

## 2018-04-06 MED ORDER — ACETAMINOPHEN 650 MG RE SUPP: 650.0000 mg | Freq: Four times a day (QID) | RECTAL | Status: DC | PRN

## 2018-04-06 MED ORDER — ACETAMINOPHEN 325 MG PO TABS: 650.0000 mg | ORAL_TABLET | Freq: Four times a day (QID) | ORAL | Status: DC | PRN

## 2018-04-06 MED ORDER — ACETAMINOPHEN 500 MG PO TABS
ORAL_TABLET | ORAL | Status: AC
  Filled 2018-04-06: qty 2

## 2018-04-06 MED ORDER — KETOROLAC TROMETHAMINE 15 MG/ML IJ SOLN: 15.0000 mg | Freq: Four times a day (QID) | INTRAMUSCULAR | Status: DC | PRN

## 2018-04-06 MED ORDER — HEMOSTATIC AGENTS (NO CHARGE) OPTIME
TOPICAL | Status: DC | PRN
  Administered 2018-04-06: 1 via TOPICAL

## 2018-04-06 MED ORDER — ROCURONIUM BROMIDE 100 MG/10ML IV SOLN
INTRAVENOUS | Status: DC | PRN
  Administered 2018-04-06: 5 mg via INTRAVENOUS

## 2018-04-06 MED ORDER — SUGAMMADEX SODIUM 200 MG/2ML IV SOLN
INTRAVENOUS | Status: AC
  Filled 2018-04-06: qty 2

## 2018-04-06 MED ORDER — ROCURONIUM BROMIDE 50 MG/5ML IV SOLN
INTRAVENOUS | Status: AC
  Filled 2018-04-06: qty 1

## 2018-04-06 MED ORDER — POVIDONE-IODINE 10 % OINT PACKET
TOPICAL_OINTMENT | CUTANEOUS | Status: DC | PRN
  Administered 2018-04-06: 1 via TOPICAL

## 2018-04-06 MED ORDER — PROMETHAZINE HCL 25 MG/ML IJ SOLN: 6.2500 mg | INTRAMUSCULAR | Status: DC | PRN

## 2018-04-06 MED ORDER — IOPAMIDOL (ISOVUE-300) INJECTION 61%
100.0000 mL | Freq: Once | INTRAVENOUS | Status: AC | PRN
  Administered 2018-04-06: 100 mL via INTRAVENOUS

## 2018-04-06 MED ORDER — SUCCINYLCHOLINE CHLORIDE 20 MG/ML IJ SOLN
INTRAMUSCULAR | Status: DC | PRN
  Administered 2018-04-06: 25 mg via INTRAVENOUS
  Administered 2018-04-06: 120 mg via INTRAVENOUS

## 2018-04-06 MED ORDER — DIPHENHYDRAMINE HCL 12.5 MG/5ML PO ELIX: 12.5000 mg | ORAL_SOLUTION | Freq: Four times a day (QID) | ORAL | Status: DC | PRN

## 2018-04-06 MED ORDER — POVIDONE-IODINE 10 % EX OINT
TOPICAL_OINTMENT | CUTANEOUS | Status: AC
  Filled 2018-04-06: qty 1

## 2018-04-06 MED ORDER — SIMETHICONE 80 MG PO CHEW: 40.0000 mg | CHEWABLE_TABLET | Freq: Four times a day (QID) | ORAL | Status: DC | PRN

## 2018-04-06 MED ORDER — ONDANSETRON HCL 4 MG/2ML IJ SOLN: 4.0000 mg | Freq: Four times a day (QID) | INTRAMUSCULAR | Status: DC | PRN

## 2018-04-06 MED ORDER — SODIUM CHLORIDE 0.9 % IR SOLN
Status: DC | PRN
  Administered 2018-04-06: 1000 mL

## 2018-04-06 MED ORDER — MIDAZOLAM HCL 2 MG/2ML IJ SOLN
INTRAMUSCULAR | Status: AC
  Filled 2018-04-06: qty 2

## 2018-04-06 MED ORDER — ONDANSETRON 4 MG PO TBDP: 4.0000 mg | ORAL_TABLET | Freq: Four times a day (QID) | ORAL | Status: DC | PRN

## 2018-04-06 MED ORDER — SODIUM CHLORIDE 0.9 % IV SOLN
INTRAVENOUS | Status: DC
  Administered 2018-04-06: 22:00:00 via INTRAVENOUS

## 2018-04-06 MED ORDER — SODIUM CHLORIDE 0.9 % IV SOLN
INTRAVENOUS | Status: DC
  Administered 2018-04-06: 100 mL/h via INTRAVENOUS

## 2018-04-06 MED ORDER — PROPOFOL 10 MG/ML IV BOLUS
INTRAVENOUS | Status: DC | PRN
  Administered 2018-04-06: 200 mg via INTRAVENOUS

## 2018-04-06 MED ORDER — FENTANYL CITRATE (PF) 100 MCG/2ML IJ SOLN
INTRAMUSCULAR | Status: DC | PRN
  Administered 2018-04-06: 50 ug via INTRAVENOUS
  Administered 2018-04-06: 75 ug via INTRAVENOUS
  Administered 2018-04-06 (×2): 50 ug via INTRAVENOUS
  Administered 2018-04-06: 75 ug via INTRAVENOUS

## 2018-04-06 MED ORDER — ENOXAPARIN SODIUM 40 MG/0.4ML ~~LOC~~ SOLN: 40.0000 mg | SUBCUTANEOUS | Status: DC

## 2018-04-06 MED ORDER — BUPIVACAINE LIPOSOME 1.3 % IJ SUSP
INTRAMUSCULAR | Status: AC
  Filled 2018-04-06: qty 20

## 2018-04-06 MED ORDER — SUCCINYLCHOLINE CHLORIDE 20 MG/ML IJ SOLN
INTRAMUSCULAR | Status: AC
  Filled 2018-04-06: qty 1

## 2018-04-06 SURGICAL SUPPLY — 44 items
APPLICATOR ARISTA FLEXITIP XL (MISCELLANEOUS) ×2 IMPLANT
BAG RETRIEVAL 10 (BASKET) ×1
CHLORAPREP W/TINT 26ML (MISCELLANEOUS) ×2 IMPLANT
CLOTH BEACON ORANGE TIMEOUT ST (SAFETY) ×2 IMPLANT
COVER LIGHT HANDLE STERIS (MISCELLANEOUS) ×4 IMPLANT
CUTTER FLEX LINEAR 45M (STAPLE) ×2 IMPLANT
DECANTER SPIKE VIAL GLASS SM (MISCELLANEOUS) ×2 IMPLANT
ELECT REM PT RETURN 9FT ADLT (ELECTROSURGICAL) ×2
ELECTRODE REM PT RTRN 9FT ADLT (ELECTROSURGICAL) ×1 IMPLANT
EVACUATOR SMOKE 8.L (FILTER) ×2 IMPLANT
GLOVE BIOGEL PI IND STRL 7.0 (GLOVE) ×4 IMPLANT
GLOVE BIOGEL PI INDICATOR 7.0 (GLOVE) ×4
GLOVE SURG SS PI 7.5 STRL IVOR (GLOVE) ×2 IMPLANT
GOWN STRL REUS W/ TWL XL LVL3 (GOWN DISPOSABLE) ×1 IMPLANT
GOWN STRL REUS W/TWL LRG LVL3 (GOWN DISPOSABLE) ×2 IMPLANT
GOWN STRL REUS W/TWL XL LVL3 (GOWN DISPOSABLE) ×1
INST SET LAPROSCOPIC AP (KITS) ×2 IMPLANT
KIT TURNOVER KIT A (KITS) ×2 IMPLANT
MANIFOLD NEPTUNE II (INSTRUMENTS) ×2 IMPLANT
NEEDLE HYPO 18GX1.5 BLUNT FILL (NEEDLE) ×2 IMPLANT
NEEDLE HYPO 22GX1.5 SAFETY (NEEDLE) ×2 IMPLANT
NEEDLE INSUFFLATION 14GA 120MM (NEEDLE) ×2 IMPLANT
NS IRRIG 1000ML POUR BTL (IV SOLUTION) ×2 IMPLANT
PACK LAP CHOLE LZT030E (CUSTOM PROCEDURE TRAY) ×2 IMPLANT
PAD ARMBOARD 7.5X6 YLW CONV (MISCELLANEOUS) ×2 IMPLANT
PENCIL HANDSWITCHING (ELECTRODE) ×2 IMPLANT
RELOAD 45 VASCULAR/THIN (ENDOMECHANICALS) ×2 IMPLANT
SET BASIN LINEN APH (SET/KITS/TRAYS/PACK) ×2 IMPLANT
SHEARS HARMONIC ACE PLUS 36CM (ENDOMECHANICALS) ×2 IMPLANT
SPONGE GAUZE 2X2 8PLY STRL LF (GAUZE/BANDAGES/DRESSINGS) ×2 IMPLANT
STAPLER VISISTAT (STAPLE) ×2 IMPLANT
SUT VICRYL 0 UR6 27IN ABS (SUTURE) ×2 IMPLANT
SYR 20CC LL (SYRINGE) ×4 IMPLANT
SYS BAG RETRIEVAL 10MM (BASKET) ×1
SYSTEM BAG RETRIEVAL 10MM (BASKET) ×1 IMPLANT
TAPE CLOTH SURG 4X10 WHT LF (GAUZE/BANDAGES/DRESSINGS) ×2 IMPLANT
TRAY FOLEY W/BAG SLVR 16FR (SET/KITS/TRAYS/PACK) ×1
TRAY FOLEY W/BAG SLVR 16FR ST (SET/KITS/TRAYS/PACK) ×1 IMPLANT
TROCAR ENDO BLADELESS 11MM (ENDOMECHANICALS) ×2 IMPLANT
TROCAR ENDO BLADELESS 12MM (ENDOMECHANICALS) ×2 IMPLANT
TROCAR XCEL NON-BLD 5MMX100MML (ENDOMECHANICALS) ×2 IMPLANT
TUBING INSUFFLATION (TUBING) ×2 IMPLANT
WARMER LAPAROSCOPE (MISCELLANEOUS) ×2 IMPLANT
YANKAUER SUCT 12FT TUBE ARGYLE (SUCTIONS) ×2 IMPLANT

## 2018-04-06 NOTE — Progress Notes (Signed)
IS in room pt asleep

## 2018-04-06 NOTE — ED Provider Notes (Signed)
[facility] EMERGENCY DEPARTMENT Provider Note   CSN: [id number] Arrival date & time: 04/06/18  1733     History   Chief Complaint Chief Complaint  Patient presents with  . Abdominal Pain    HPI William [name] is a 67 y.o. male.  HPI  Pt was seen at 1745.  Per pt, c/o gradual onset and persistence of constant RLQ abd "pain" since yesterday.  Has been associated with no other symptoms.  Describes the abd pain as "sharp." Home temp max "100." Pt was evaluated by his PMD with outpatient labs and CT scan, then sent to the ED for further evaluation. Pt states 2 weeks ago he had one day of N/V/D; since resolved.  Denies N/V, no diarrhea, no fevers, no back pain, no rash, no CP/SOB, no black or blood in stools, no dysuria/hematuria.       Past Medical History:  Diagnosis Date  . Hyperlipidemia   . Sarcoidosis 08/2013   Diagnosed with bronchoscopy. Pulmonary Involvement only.     Patient Active Problem List   Diagnosis Date Noted  . Restrictive lung disease 11/14/2016  . Chronic seasonal allergic rhinitis 11/14/2016  . Psoriasis 04/27/2014  . Pulmonary sarcoidosis ([place]) 09/05/2013    Past Surgical History:  Procedure Laterality Date  . CHOLECYSTECTOMY    . COLONOSCOPY    . VIDEO BRONCHOSCOPY Bilateral 09/14/2013   Procedure: VIDEO BRONCHOSCOPY WITH FLUORO;  Surgeon: Kathee Delton, MD;  Location: [facility]OSCOPY;  Service: Cardiopulmonary;  Laterality: Bilateral;        Home Medications    Prior to Admission medications   Medication Sig Start Date End Date Taking? Authorizing Provider  acetaminophen (TYLENOL) 325 MG tablet Take 325 mg by mouth as needed for moderate pain.    [provider]  cholecalciferol (VITAMIN D) 400 units TABS tablet Take 400 Units by mouth daily.    [provider]  fluticasone (FLONASE) 50 MCG/ACT nasal spray Place 2 sprays into both nostrils as needed.  08/01/13   [provider]    Family History Family History    Problem Relation Age of Onset  . Allergies Father   . Heart disease Father   . Lung disease Neg Hx   . Rheumatologic disease Neg Hx     Social History Social History   Tobacco Use  . Smoking status: Never Smoker  . Smokeless tobacco: Never Used  Substance Use Topics  . Alcohol use: No  . Drug use: No     Allergies   Patient has no known allergies.   Review of Systems Review of Systems ROS: Statement: All systems negative except as marked or noted in the HPI; Constitutional: Negative for fever and chills. ; ; Eyes: Negative for eye pain, redness and discharge. ; ; ENMT: Negative for ear pain, hoarseness, nasal congestion, sinus pressure and sore throat. ; ; Cardiovascular: Negative for chest pain, palpitations, diaphoresis, dyspnea and peripheral edema. ; ; Respiratory: Negative for cough, wheezing and stridor. ; ; Gastrointestinal: +abd pain. Negative for nausea, vomiting, diarrhea, blood in stool, hematemesis, jaundice and rectal bleeding. . ; ; Genitourinary: Negative for dysuria, flank pain and hematuria. ; ; Genital:  No penile drainage or rash, no testicular pain or swelling, no scrotal rash or swelling. ;; Musculoskeletal: Negative for back pain and neck pain. Negative for swelling and trauma.; ; Skin: Negative for pruritus, rash, abrasions, blisters, bruising and skin lesion.; ; Neuro: Negative for headache, lightheadedness and neck stiffness. Negative for weakness, altered level  of consciousness, altered mental status, extremity weakness, paresthesias, involuntary movement, seizure and syncope.       Physical Exam Updated Vital Signs BP 134/74 (BP Location: Left Arm)   Pulse 82   Temp 98.7 F (37.1 C) (Oral)   Resp 20   Ht 5\' 8"  (1.727 m)   Wt 78.7 kg   SpO2 99%   BMI 26.38 kg/m   Physical Exam 1750: Physical examination:  Nursing notes reviewed; Vital signs and O2 SAT reviewed;  Constitutional: Well developed, Well nourished, Well hydrated, Uncomfortable  appearing.; Head:  Normocephalic, atraumatic; Eyes: EOMI, PERRL, No scleral icterus; ENMT: Mouth and pharynx normal, Mucous membranes moist; Neck: Supple, Full range of motion, No lymphadenopathy; Cardiovascular: Regular rate and rhythm, No gallop; Respiratory: Breath sounds clear & equal bilaterally, No wheezes.  Speaking full sentences with ease, Normal respiratory effort/excursion; Chest: Nontender, Movement normal; Abdomen:  +RLQ tenderness to palp. +softly distended, Decreased bowel sounds; Genitourinary: No CVA tenderness; Extremities: Peripheral pulses normal, No tenderness, No edema, No calf edema or asymmetry.; Neuro: AA&Ox3, Major CN grossly intact.  Speech clear. No gross focal motor or sensory deficits in extremities.; Skin: Color normal, Warm, Dry.   ED Treatments / Results  Labs (all labs ordered are listed, but only abnormal results are displayed)   EKG None  Radiology   Procedures Procedures (including critical care time)  Medications Ordered in ED Medications  0.9 %  sodium chloride infusion (has no administration in time range)  morphine 4 MG/ML injection 4 mg (has no administration in time range)  ondansetron (ZOFRAN) injection 4 mg (has no administration in time range)     Initial Impression / Assessment and Plan / ED Course  I have reviewed the triage vital signs and the nursing notes.  Pertinent labs & imaging results that were available during my care of the patient were reviewed by me and considered in my medical decision making (see chart for details).  MDM Reviewed: previous chart, nursing note and vitals Reviewed previous: labs and CT scan     Results for orders placed or performed during the hospital encounter of 04/06/18  Comprehensive metabolic panel  Result Value Ref Range   Sodium 134 (L) 135 - 145 mmol/L   Potassium 4.1 3.5 - 5.1 mmol/L   Chloride 99 98 - 111 mmol/L   CO2 27 22 - 32 mmol/L   Glucose, Bld 122 (H) 70 - 99 mg/dL   BUN 14 8 -  23 mg/dL   Creatinine, Ser 0.93 0.61 - 1.24 mg/dL   Calcium 8.9 8.9 - 10.3 mg/dL   Total Protein 8.4 (H) 6.5 - 8.1 g/dL   Albumin 4.5 3.5 - 5.0 g/dL   AST 23 15 - 41 U/L   ALT 19 0 - 44 U/L   Alkaline Phosphatase 75 38 - 126 U/L   Total Bilirubin 1.9 (H) 0.3 - 1.2 mg/dL   GFR calc non Af Amer >60 >60 mL/min   GFR calc Af Amer >60 >60 mL/min   Anion gap 8 5 - 15  CBC with Differential/Platelet  Result Value Ref Range   WBC 14.8 (H) 4.0 - 10.5 K/uL   RBC 5.30 4.22 - 5.81 MIL/uL   Hemoglobin 15.6 13.0 - 17.0 g/dL   HCT 48.6 39.0 - 52.0 %   MCV 91.7 80.0 - 100.0 fL   MCH 29.4 26.0 - 34.0 pg   MCHC 32.1 30.0 - 36.0 g/dL   RDW 12.9 11.5 - 15.5 %   Platelets 244  150 - 400 K/uL   nRBC 0.0 0.0 - 0.2 %   Neutrophils Relative % 87 %   Neutro Abs 12.8 (H) 1.7 - 7.7 K/uL   Lymphocytes Relative 4 %   Lymphs Abs 0.6 (L) 0.7 - 4.0 K/uL   Monocytes Relative 8 %   Monocytes Absolute 1.2 (H) 0.1 - 1.0 K/uL   Eosinophils Relative 0 %   Eosinophils Absolute 0.0 0.0 - 0.5 K/uL   Basophils Relative 0 %   Basophils Absolute 0.0 0.0 - 0.1 K/uL   Immature Granulocytes 1 %   Abs Immature Granulocytes 0.08 (H) 0.00 - 0.07 K/uL   Ct Abdomen Pelvis W Contrast Result Date: 04/06/2018 CLINICAL DATA:  Acute right lower quadrant abdominal pain. EXAM: CT ABDOMEN AND PELVIS WITH CONTRAST TECHNIQUE: Multidetector CT imaging of the abdomen and pelvis was performed using the standard protocol following bolus administration of intravenous contrast. CONTRAST:  168mL ISOVUE-300 IOPAMIDOL (ISOVUE-300) INJECTION 61% COMPARISON:  CT scan of April 22, 2010. FINDINGS: Lower chest: No acute abnormality. Hepatobiliary: No focal liver abnormality is seen. No gallstones, gallbladder wall thickening, or biliary dilatation. Pancreas: Unremarkable. No pancreatic ductal dilatation or surrounding inflammatory changes. Spleen: Normal in size without focal abnormality. Adrenals/Urinary Tract: Adrenal glands appear normal. Horseshoe  configuration of the kidneys is noted. No hydronephrosis or renal obstruction is noted. Stomach/Bowel: The stomach appears normal. There is no evidence of bowel obstruction. The appendix is enlarged with surrounding inflammation consistent with acute appendicitis. Appendix: Location: Right lower quadrant. Diameter: 12 mm. Appendicolith: No. Mucosal hyper-enhancement: Yes. Extraluminal gas: No. Periappendiceal collection: No. Vascular/Lymphatic: No significant vascular findings are present. No enlarged abdominal or pelvic lymph nodes. Reproductive: Moderate prostatic enlargement is noted with lobular extension into inferior portion of urinary bladder. Other: No abdominal wall hernia or abnormality. No abdominopelvic ascites. Musculoskeletal: No acute or significant osseous findings. IMPRESSION: Findings consistent with acute appendicitis. No abscess is noted. These results will be called to the ordering clinician or representative by the Radiologist Assistant, and communication documented in the PACS or zVision Dashboard. Moderate prostatic enlargement is noted with lobular extension into inferior portion of urinary bladder. Electronically Signed   By: Marijo Conception, M.D.   On: 04/06/2018 17:14      1805:  Pt has been NPO since this morning. IV abx started. Dx and testing d/w pt and family.  Questions answered.  Verb understanding, agreeable to admit.  T/C returned from General Surgery Dr. Arnoldo Morale, case discussed, including:  HPI, pertinent PM/SHx, VS/PE, dx testing, ED course and treatment:  Agreeable to come to the ED for evaluation.       Final Clinical Impressions(s) / ED Diagnoses   Final diagnoses:  None    ED Discharge Orders    None       Francine Graven, DO 04/09/18 2112

## 2018-04-06 NOTE — H&P (Signed)
William [name] is an 67 y.o. male.   Chief Complaint: Right lower quadrant abdominal pain HPI: Patient is a 67 year old white male who presented to his primary care physician with worsening right lower quadrant abdominal pain.  He was found on CT scan of the abdomen to have acute appendicitis.  Patient states that this has been worsening over the last 24 hours.  He denies any fevers.  Past Medical History:  Diagnosis Date  . Hyperlipidemia   . Sarcoidosis 08/2013   Diagnosed with bronchoscopy. Pulmonary Involvement only.     Past Surgical History:  Procedure Laterality Date  . CHOLECYSTECTOMY    . COLONOSCOPY    . VIDEO BRONCHOSCOPY Bilateral 09/14/2013   Procedure: VIDEO BRONCHOSCOPY WITH FLUORO;  Surgeon: Kathee Delton, MD;  Location: [facility];  Service: Cardiopulmonary;  Laterality: Bilateral;    Family History  Problem Relation Age of Onset  . Allergies Father   . Heart disease Father   . Lung disease Neg Hx   . Rheumatologic disease Neg Hx    Social History:  reports that he has never smoked. He has never used smokeless tobacco. He reports that he does not drink alcohol or use drugs.  Allergies: No Known Allergies   (Not in a hospital admission)  Results for orders placed or performed during the hospital encounter of 04/06/18 (from the past 48 hour(s))  Comprehensive metabolic panel     Status: Abnormal   Collection Time: 04/06/18  3:31 PM  Result Value Ref Range   Sodium 134 (L) 135 - 145 mmol/L   Potassium 4.1 3.5 - 5.1 mmol/L   Chloride 99 98 - 111 mmol/L   CO2 27 22 - 32 mmol/L   Glucose, Bld 122 (H) 70 - 99 mg/dL   BUN 14 8 - 23 mg/dL   Creatinine, Ser 0.93 0.61 - 1.24 mg/dL   Calcium 8.9 8.9 - 10.3 mg/dL   Total Protein 8.4 (H) 6.5 - 8.1 g/dL   Albumin 4.5 3.5 - 5.0 g/dL   AST 23 15 - 41 U/L   ALT 19 0 - 44 U/L   Alkaline Phosphatase 75 38 - 126 U/L   Total Bilirubin 1.9 (H) 0.3 - 1.2 mg/dL   GFR calc non Af Amer >60 >60 mL/min   GFR calc Af Amer >60  >60 mL/min    Comment: (NOTE) The eGFR has been calculated using the CKD EPI equation. This calculation has not been validated in all clinical situations. eGFR's persistently <60 mL/min signify possible Chronic Kidney Disease.    Anion gap 8 5 - 15    Comment: Performed at [facility], [address], [place],  [address]  CBC with Differential/Platelet     Status: Abnormal   Collection Time: 04/06/18  3:31 PM  Result Value Ref Range   WBC 14.8 (H) 4.0 - 10.5 K/uL   RBC 5.30 4.22 - 5.81 MIL/uL   Hemoglobin 15.6 13.0 - 17.0 g/dL   HCT 48.6 39.0 - 52.0 %   MCV 91.7 80.0 - 100.0 fL   MCH 29.4 26.0 - 34.0 pg   MCHC 32.1 30.0 - 36.0 g/dL   RDW 12.9 11.5 - 15.5 %   Platelets 244 150 - 400 K/uL   nRBC 0.0 0.0 - 0.2 %   Neutrophils Relative % 87 %   Neutro Abs 12.8 (H) 1.7 - 7.7 K/uL   Lymphocytes Relative 4 %   Lymphs Abs 0.6 (L) 0.7 - 4.0 K/uL   Monocytes Relative  8 %   Monocytes Absolute 1.2 (H) 0.1 - 1.0 K/uL   Eosinophils Relative 0 %   Eosinophils Absolute 0.0 0.0 - 0.5 K/uL   Basophils Relative 0 %   Basophils Absolute 0.0 0.0 - 0.1 K/uL   Immature Granulocytes 1 %   Abs Immature Granulocytes 0.08 (H) 0.00 - 0.07 K/uL    Comment: Performed at [facility], [address], [place], [place] [address]   Ct Abdomen Pelvis W Contrast  Result Date: 04/06/2018 CLINICAL DATA:  Acute right lower quadrant abdominal pain. EXAM: CT ABDOMEN AND PELVIS WITH CONTRAST TECHNIQUE: Multidetector CT imaging of the abdomen and pelvis was performed using the standard protocol following bolus administration of intravenous contrast. CONTRAST:  145m ISOVUE-300 IOPAMIDOL (ISOVUE-300) INJECTION 61% COMPARISON:  CT scan of April 22, 2010. FINDINGS: Lower chest: No acute abnormality. Hepatobiliary: No focal liver abnormality is seen. No gallstones, gallbladder wall thickening, or biliary dilatation. Pancreas: Unremarkable. No pancreatic ductal dilatation or surrounding inflammatory changes. Spleen:  Normal in size without focal abnormality. Adrenals/Urinary Tract: Adrenal glands appear normal. Horseshoe configuration of the kidneys is noted. No hydronephrosis or renal obstruction is noted. Stomach/Bowel: The stomach appears normal. There is no evidence of bowel obstruction. The appendix is enlarged with surrounding inflammation consistent with acute appendicitis. Appendix: Location: Right lower quadrant. Diameter: 12 mm. Appendicolith: No. Mucosal hyper-enhancement: Yes. Extraluminal gas: No. Periappendiceal collection: No. Vascular/Lymphatic: No significant vascular findings are present. No enlarged abdominal or pelvic lymph nodes. Reproductive: Moderate prostatic enlargement is noted with lobular extension into inferior portion of urinary bladder. Other: No abdominal wall hernia or abnormality. No abdominopelvic ascites. Musculoskeletal: No acute or significant osseous findings. IMPRESSION: Findings consistent with acute appendicitis. No abscess is noted. These results will be called to the ordering clinician or representative by the Radiologist Assistant, and communication documented in the PACS or zVision Dashboard. Moderate prostatic enlargement is noted with lobular extension into inferior portion of urinary bladder. Electronically Signed   By: JMarijo Conception M.D.   On: 04/06/2018 17:14    Review of Systems  Constitutional: Positive for malaise/fatigue.  HENT: Negative.   Eyes: Negative.   Respiratory: Negative.   Cardiovascular: Negative.   Gastrointestinal: Positive for abdominal pain. Negative for nausea and vomiting.  Genitourinary: Negative.   Musculoskeletal: Negative.   Skin: Negative.   Neurological: Negative.   Endo/Heme/Allergies: Negative.   Psychiatric/Behavioral: Negative.     Blood pressure 128/69, pulse 80, temperature 98.7 F (37.1 C), temperature source Oral, resp. rate 20, height _0  (1.727 m), weight 78.7 kg, SpO2 96 %. Physical Exam  Vitals  reviewed. Constitutional: He is oriented to person, place, and time. He appears well-developed and well-nourished. No distress.  HENT:  Head: Normocephalic and atraumatic.  Cardiovascular: Normal rate, regular rhythm and normal heart sounds. Exam reveals no gallop and no friction rub.  No murmur heard. Respiratory: Effort normal and breath sounds normal. No respiratory distress. He has no wheezes. He has no rales.  GI: Soft. He exhibits no distension. There is tenderness. There is guarding. There is no rebound.  Tender in the right lower quadrant to palpation.  No rigidity is noted.  Neurological: He is alert and oriented to person, place, and time.  Skin: Skin is warm and dry.  CT scan images personally reviewed  Assessment/Plan Impression: Acute appendicitis.  Patient with history of pulmonary sarcoidosis which is stable at this time. Plan: Patient will be taken to the operating room for laparoscopic appendectomy.  The risks and benefits  of the procedure including bleeding, infection, pulmonary difficulties, and the possibility of an open procedure were fully explained to the patient, who gave informed consent.  Aviva Signs, MD 04/06/2018, 7:02 PM

## 2018-04-06 NOTE — Anesthesia Postprocedure Evaluation (Signed)
Anesthesia Post Note  Patient: William [name]  Procedure(s) Performed: APPENDECTOMY LAPAROSCOPIC (N/A Abdomen)  Patient location during evaluation: PACU Anesthesia Type: General Level of consciousness: awake and awake and alert Pain management: pain level controlled Vital Signs Assessment: post-procedure vital signs reviewed and stable Respiratory status: spontaneous breathing and nonlabored ventilation Cardiovascular status: blood pressure returned to baseline Postop Assessment: no headache Anesthetic complications: no     Last Vitals:  Vitals:   04/06/18 1900 04/06/18 2021  BP: 131/73   Pulse: 78   Resp:    Temp:  (P) 37.6 C  SpO2: 96% (P) 100%    Last Pain:  Vitals:   04/06/18 2021  TempSrc:   PainSc: (P) 0-No pain                 Talbert [place] Woodard Perrell

## 2018-04-06 NOTE — ED Triage Notes (Signed)
Pt c/o abd pain that started yesterday.  Went to PCP today and was sent here for CT scan.  CT tech called ER and reports pt has acute appendicitis.  Reports nausea.

## 2018-04-06 NOTE — ED Notes (Signed)
Or team here for pt,

## 2018-04-06 NOTE — Progress Notes (Signed)
Subjective:  Patient ID: William [name], male    DOB: 1950/10/22  Age: 67 y.o. MRN: [id number]  CC: Abdominal Pain (Lower) and Congestive Heart Failure   HPI William [name] presents for onset yesterday of right lower quadrant pain with some radiation to the left lower quadrant associated.  More intense on the right.  He had some nausea week and a half ago that resolved.  His appetite is been poor ever since.  Currently he describes the pain is sharp with some cramping.  He denies nausea vomiting and diarrhea.  He did have a fever of 100 degrees this morning.  There are no urinary symptoms including frequency urgency dysuria.  Depression screen [facility] 2/9 04/06/2018 10/02/2017  Decreased Interest 0 0  Down, Depressed, Hopeless 0 0  PHQ - 2 Score 0 0    History Saxon has a past medical history of Hyperlipidemia and Sarcoidosis (08/2013).   He has a past surgical history that includes Cholecystectomy; Video bronchoscopy (Bilateral, 09/14/2013); and Colonoscopy.   His family history includes Allergies in his father; Heart disease in his father.He reports that he has never smoked. He has never used smokeless tobacco. He reports that he does not drink alcohol or use drugs.    ROS Review of Systems  Constitutional: Positive for appetite change (Decreased), fatigue and fever. Negative for chills, diaphoresis and unexpected weight change.  HENT: Negative for rhinorrhea and trouble swallowing.   Respiratory: Negative for cough, chest tightness and shortness of breath.   Cardiovascular: Negative for chest pain.  Gastrointestinal: Positive for abdominal pain and nausea (See HPI). Negative for abdominal distention, blood in stool, constipation, diarrhea, rectal pain and vomiting.  Genitourinary: Negative for dysuria, flank pain and hematuria.  Musculoskeletal: Negative for arthralgias and joint swelling.  Skin: Negative for rash.  Neurological: Negative for syncope and headaches.    Objective:    BP 117/66   Pulse 85   Temp (!) 97.2 F (36.2 C) (Oral)   Ht '5\' 8"'  (1.727 m)   Wt 173 lb 9.6 oz (78.7 kg)   BMI 26.40 kg/m   BP Readings from Last 3 Encounters:  04/06/18 117/66  11/18/17 118/62  10/02/17 122/72    Wt Readings from Last 3 Encounters:  04/06/18 173 lb 9.6 oz (78.7 kg)  11/18/17 178 lb 6.4 oz (80.9 kg)  10/02/17 182 lb 6.4 oz (82.7 kg)     Physical Exam  Constitutional: He appears well-developed and well-nourished. He appears ill.  HENT:  Head: Normocephalic and atraumatic.  Right Ear: Tympanic membrane and external ear normal. No decreased hearing is noted.  Left Ear: Tympanic membrane and external ear normal. No decreased hearing is noted.  Mouth/Throat: No oropharyngeal exudate or posterior oropharyngeal erythema.  Eyes: Pupils are equal, round, and reactive to light.  Neck: Normal range of motion. Neck supple.  Cardiovascular: Normal rate, regular rhythm and normal heart sounds.  No murmur heard. Pulmonary/Chest: Breath sounds normal. No respiratory distress.  Abdominal: Soft. Bowel sounds are normal. He exhibits no mass. There is no hepatosplenomegaly. There is tenderness in the right lower quadrant. There is tenderness at McBurney's point. There is no rigidity, no rebound, no guarding, no CVA tenderness and negative Murphy's sign (Patient has old gallbladder scars from cholecystectomy).  Vitals reviewed.     Assessment & Plan:   Traci was seen today for abdominal pain and congestive heart failure.  Diagnoses and all orders for this visit:  Abdominal pain, unspecified abdominal location -  Urinalysis, Complete -     Urine Culture; Future -     Urine Culture  Right lower quadrant abdominal pain -     CBC with Differential/Platelet -     CMP14+EGFR -     Cancel: CT ABDOMEN W CONTRAST; Future  Lower abdominal pain -     Cancel: CT Abdomen Pelvis W Contrast; Future -     CT ABDOMEN W CONTRAST; Future    CT reordered as stat.   I  am having William [name] maintain his fluticasone, acetaminophen, and cholecalciferol.  Allergies as of 04/06/2018   No Known Allergies     Medication List        Accurate as of 04/06/18 12:53 PM. Always use your most recent med list.          acetaminophen 325 MG tablet Commonly known as:  TYLENOL Take 325 mg by mouth as needed for moderate pain.   cholecalciferol 400 units Tabs tablet Commonly known as:  VITAMIN D Take 400 Units by mouth daily.   fluticasone 50 MCG/ACT nasal spray Commonly known as:  FLONASE Place 2 sprays into both nostrils as needed.        Follow-up: Return in about 2 days (around 04/08/2018).  Claretta Fraise, M.D.

## 2018-04-06 NOTE — Transfer of Care (Signed)
Immediate Anesthesia Transfer of Care Note  Patient: William [name]  Procedure(s) Performed: APPENDECTOMY LAPAROSCOPIC (N/A Abdomen)  Patient Location: PACU  Anesthesia Type:General  Level of Consciousness: awake  Airway & Oxygen Therapy: Patient Spontanous Breathing  Post-op Assessment: Report given to RN  Post vital signs: Reviewed and stable  Last Vitals:  Vitals Value Taken Time  BP 152/83 04/06/2018  8:21 PM  Temp    Pulse 97 04/06/2018  8:25 PM  Resp 20 04/06/2018  8:25 PM  SpO2 100 % 04/06/2018  8:25 PM  Vitals shown include unvalidated device data.  Last Pain:  Vitals:   04/06/18 1815  TempSrc:   PainSc: 5          Complications: No apparent anesthesia complications

## 2018-04-06 NOTE — Anesthesia Preprocedure Evaluation (Signed)
Anesthesia Evaluation  Patient identified by MRN, date of birth, ID band Patient awake    Reviewed: Allergy & Precautions, NPO status , Patient's Chart, lab work & pertinent test results  Airway Mallampati: II  TM Distance: >3 FB Neck ROM: Full    Dental no notable dental hx. (+) Teeth Intact   Pulmonary neg pulmonary ROS,  H/o Sarcoid - no current steroid or o2 use    Pulmonary exam normal breath sounds clear to auscultation       Cardiovascular Exercise Tolerance: Good negative cardio ROS Normal cardiovascular examI Rhythm:Regular Rate:Normal     Neuro/Psych negative neurological ROS  negative psych ROS   GI/Hepatic negative GI ROS, Neg liver ROS,   Endo/Other  negative endocrine ROS  Renal/GU negative Renal ROS  negative genitourinary   Musculoskeletal negative musculoskeletal ROS (+)   Abdominal   Peds negative pediatric ROS (+)  Hematology negative hematology ROS (+)   Anesthesia Other Findings   Reproductive/Obstetrics negative OB ROS                             Anesthesia Physical Anesthesia Plan  ASA: II and emergent  Anesthesia Plan: General   Post-op Pain Management:    Induction: Intravenous  PONV Risk Score and Plan:   Airway Management Planned: Oral ETT  Additional Equipment:   Intra-op Plan:   Post-operative Plan: Extubation in OR  Informed Consent: I have reviewed the patients History and Physical, chart, labs and discussed the procedure including the risks, benefits and alternatives for the proposed anesthesia with the patient or authorized representative who has indicated his/her understanding and acceptance.   Dental advisory given  Plan Discussed with:   Anesthesia Plan Comments:         Anesthesia Quick Evaluation

## 2018-04-06 NOTE — ED Notes (Signed)
Dr Arnoldo Morale at bedside,

## 2018-04-06 NOTE — ED Notes (Signed)
Pt has been NPO today, denies vomiting or diarrhea

## 2018-04-06 NOTE — Anesthesia Procedure Notes (Signed)
Procedure Name: Intubation Date/Time: 04/06/2018 6:41 PM Performed by: Lenice Llamas, MD Pre-anesthesia Checklist: Patient identified, Patient being monitored, Timeout performed, Emergency Drugs available and Suction available Patient Re-evaluated:Patient Re-evaluated prior to induction Oxygen Delivery Method: Circle System Utilized Preoxygenation: Pre-oxygenation with 100% oxygen Induction Type: IV induction Ventilation: Mask ventilation without difficulty Laryngoscope Size: Mac and 4 Grade View: Grade I Tube type: Oral Tube size: 7.0 mm Number of attempts: 1 Airway Equipment and Method: Stylet Placement Confirmation: ETT inserted through vocal cords under direct vision,  positive ETCO2 and breath sounds checked- equal and bilateral Secured at: 23 cm Tube secured with: Tape Dental Injury: Teeth and Oropharynx as per pre-operative assessment

## 2018-04-06 NOTE — Op Note (Signed)
Patient:  William [name]  DOB:  07/15/1950  MRN:  [id number]   Preop Diagnosis: Acute appendicitis  Postop Diagnosis: Same  Procedure: Laparoscopic appendectomy  Surgeon: Aviva Signs, MD  Anes: General endotracheal  Indications: Patient is a 67 year old white male who presented to the emergency room with worsening right-sided abdominal pain.  CT scan of the abdomen revealed acute appendicitis.  The risks and benefits of the procedure including bleeding, infection, pulmonary difficulties, and the possibility of an open procedure were fully explained to the patient, who gave informed consent.  Procedure note: The patient was placed in supine position.  After induction of general endotracheal anesthesia, the abdomen was prepped and draped using the usual sterile technique with DuraPrep.  Surgical site confirmation was performed.  A supraumbilical incision was made down to the fascia.  A Veress needle was introduced into the abdominal cavity and confirmation of placement was done using saline drop test.  The abdomen was then insufflated to 16 mmHg pressure.  An 11 mm trocar was introduced into the abdominal cavity under direct visualization without difficulty.  The patient was placed deeper Trendelenburg position and an additional 12 mm trocar was placed in suprapubic region and a 5 mm trocar was placed left lower quadrant region.  The appendix was visualized and noted to be acutely inflamed.  The mesoappendix was divided using the harmonic scalpel up to its juncture with the cecum.  A vascular Endo GIA was placed across the base of the appendix at the level of cecum and fired.  The appendix was then removed using an Endo Catch bag without difficulty.  The staple line was inspected and noted to be within normal limits.  Arista was placed in the surrounding area due to its inflammatory response to the abdominal wall.  All fluid and air were then evacuated from the abdominal cavity prior to the  removal of the trochars.  All wounds were irrigated with normal saline.  All wounds were injected with Exparel.  The supraumbilical fascia as well as suprapubic fascia was reapproximated using 0 Vicryl interrupted sutures.  All skin incisions were closed using staples.  Betadine ointment and dry sterile dressings were applied.  Altered needle counts were correct at the end of the procedure.  Patient was extubated in the operating room and transferred to PACU in stable condition.  Complications: None  EBL: Minimal  Specimen: Appendix

## 2018-04-07 ENCOUNTER — Encounter (HOSPITAL_COMMUNITY): Payer: Self-pay | Admitting: General Surgery

## 2018-04-07 DIAGNOSIS — K3533 Acute appendicitis with perforation and localized peritonitis, with abscess: Secondary | ICD-10-CM | POA: Diagnosis not present

## 2018-04-07 DIAGNOSIS — D860 Sarcoidosis of lung: Secondary | ICD-10-CM | POA: Diagnosis not present

## 2018-04-07 DIAGNOSIS — Z7951 Long term (current) use of inhaled steroids: Secondary | ICD-10-CM | POA: Diagnosis not present

## 2018-04-07 LAB — CBC
HCT: 41.1 % (ref 39.0–52.0)
Hemoglobin: 13.5 g/dL (ref 13.0–17.0)
MCH: 30.1 pg (ref 26.0–34.0)
MCHC: 32.8 g/dL (ref 30.0–36.0)
MCV: 91.7 fL (ref 80.0–100.0)
PLATELETS: 170 10*3/uL (ref 150–400)
RBC: 4.48 MIL/uL (ref 4.22–5.81)
RDW: 13.1 % (ref 11.5–15.5)
WBC: 10.4 10*3/uL (ref 4.0–10.5)
nRBC: 0 % (ref 0.0–0.2)

## 2018-04-07 LAB — BASIC METABOLIC PANEL
ANION GAP: 7 (ref 5–15)
BUN: 17 mg/dL (ref 8–23)
CALCIUM: 8 mg/dL — AB (ref 8.9–10.3)
CO2: 25 mmol/L (ref 22–32)
CREATININE: 1.04 mg/dL (ref 0.61–1.24)
Chloride: 103 mmol/L (ref 98–111)
GFR calc Af Amer: >60 mL/min (ref >60)
GLUCOSE: 120 mg/dL — AB (ref 70–99)
Potassium: 3.9 mmol/L (ref 3.5–5.1)
Sodium: 135 mmol/L (ref 135–145)

## 2018-04-07 LAB — URINE CULTURE

## 2018-04-07 MED ORDER — HYDROCODONE-ACETAMINOPHEN 5-325 MG PO TABS: 1.0000 | ORAL_TABLET | ORAL | 0 refills | Status: DC | PRN

## 2018-04-07 NOTE — Discharge Summary (Signed)
Physician Discharge Summary  Patient ID: William [name] MRN: [id number] DOB/AGE: 1950-12-13 67 y.o.  Admit date: 04/06/2018 Discharge date: 04/07/2018  Admission Diagnoses: Acute appendicitis  Discharge Diagnoses: Same Active Problems:   Acute appendicitis, uncomplicated   S/P laparoscopic appendectomy Pulmonary sarcoidosis  Discharged Condition: good  Hospital Course: Patient is a 67 year old white male who presented to the emergency room with worsening right-sided abdominal pain.  CT scan of the abdomen revealed acute appendicitis.  Patient was taken to the operating room on 04/06/2018 and underwent a laparoscopic appendectomy.  Tolerated procedure well.  His postoperative course has been unremarkable.  His diet was advanced without difficulty.  The patient is being discharged home on 04/07/2018 in good and improving condition.   Treatments: surgery: Laparoscopic appendectomy on 04/06/2018  Discharge Exam: Blood pressure (!) 99/58, pulse 72, temperature 98.8 F (37.1 C), temperature source Oral, resp. rate 18, height 5\' 7"  (1.702 m), weight 81.9 kg, SpO2 96 %. General appearance: alert, cooperative and no distress Resp: clear to auscultation bilaterally Cardio: regular rate and rhythm, S1, S2 normal, no murmur, click, rub or gallop GI: Soft, dressing is dry and intact.  Disposition: Discharge disposition: 01-Home or Self Care       Discharge Instructions    Diet - low sodium heart healthy   Complete by:  As directed    Increase activity slowly   Complete by:  As directed      Allergies as of 04/07/2018   No Known Allergies     Medication List    TAKE these medications   acetaminophen 325 MG tablet Commonly known as:  TYLENOL Take 325 mg by mouth as needed for moderate pain.   cholecalciferol 400 units Tabs tablet Commonly known as:  VITAMIN D Take 400 Units by mouth daily.   fluticasone 50 MCG/ACT nasal spray Commonly known as:  FLONASE Place 2 sprays into  both nostrils daily as needed for allergies or rhinitis.   HYDROcodone-acetaminophen 5-325 MG tablet Commonly known as:  NORCO/VICODIN Take 1 tablet by mouth every 4 (four) hours as needed for moderate pain.      Follow-up Information    Aviva Signs, MD. Schedule an appointment as soon as possible for a visit on 04/13/2018.   Specialty:  General Surgery Contact information: [id number] [place] [address] [phone]           Signed: Aviva Signs 04/07/2018, 7:43 AM

## 2018-04-07 NOTE — Discharge Instructions (Signed)
Laparoscopic Appendectomy, Adult, Care After °Refer to this sheet in the next few weeks. These instructions provide you with information about caring for yourself after your procedure. Your health care provider may also give you more specific instructions. Your treatment has been planned according to current medical practices, but problems sometimes occur. Call your health care provider if you have any problems or questions after your procedure. °What can I expect after the procedure? °After the procedure, it is common to have: °· A decrease in your energy level. °· Mild pain in the area where the surgical cuts (incisions) were made. °· Constipation. This can be caused by pain medicine and a decrease in your activity. ° °Follow these instructions at home: °Medicines °· Take over-the-counter and prescription medicines only as told by your health care provider. °· Do not drive for 24 hours if you received a sedative. °· Do not drive or operate heavy machinery while taking prescription pain medicine. °· If you were prescribed an antibiotic medicine, take it as told by your health care provider. Do not stop taking the antibiotic even if you start to feel better. °Activity °· For 3 weeks or as long as told by your health care provider: °? Do not lift anything that is heavier than 10 pounds (4.5 kg). °? Do not play contact sports. °· Gradually return to your normal activities. Ask your health care provider what activities are safe for you. °Bathing °· Keep your incisions clean and dry. Clean them as often as told by your health care provider: °? Gently wash the incisions with soap and water. °? Rinse the incisions with water to remove all soap. °? Pat the incisions dry with a clean towel. Do not rub the incisions. °· You may take showers after 48 hours. °· Do not take baths, swim, or use hot tubs for 2 weeks or as told by your health care provider. °Incision care °· Follow instructions from your healthcare provider about  how to take care of your incisions. Make sure you: °? Wash your hands with soap and water before you change your bandage (dressing). If soap and water are not available, use hand sanitizer. °? Change your dressing as told by your health care provider. °? Leave stitches (sutures), skin glue, or adhesive strips in place. These skin closures may need to stay in place for 2 weeks or longer. If adhesive strip edges start to loosen and curl up, you may trim the loose edges. Do not remove adhesive strips completely unless your health care provider tells you to do that. °· Check your incision areas every day for signs of infection. Check for: °? More redness, swelling, or pain. °? More fluid or blood. °? Warmth. °? Pus or a bad smell. °Other Instructions °· If you were sent home with a drain, follow instructions from your health care provider about how to care for the drain and how to empty it. °· Take deep breaths. This helps to prevent your lungs from becoming inflamed. °· To relieve and prevent constipation: °? Drink plenty of fluids. °? Eat plenty of fruits and vegetables. °· Keep all follow-up visits as told by your health care provider. This is important. °Contact a health care provider if: °· You have more redness, swelling, or pain around an incision. °· You have more fluid or blood coming from an incision. °· Your incision feels warm to the touch. °· You have pus or a bad smell coming from an incision or dressing. °· Your incision   edges break open after your sutures have been removed. °· You have increasing pain in your shoulders. °· You feel dizzy or you faint. °· You develop shortness of breath. °· You keep feeling nauseous or vomiting. °· You have diarrhea or you cannot control your bowel functions. °· You lose your appetite. °· You develop swelling or pain in your legs. °Get help right away if: °· You have a fever. °· You develop a rash. °· You have difficulty breathing. °· You have sharp pains in your  chest. °This information is not intended to replace advice given to you by your health care provider. Make sure you discuss any questions you have with your health care provider. °Document Released: 06/16/2005 Document Revised: 11/16/2015 Document Reviewed: 12/04/2014 °Elsevier Interactive Patient Education © 2018 [employer] ° °

## 2018-04-07 NOTE — Progress Notes (Signed)
Discharge instructions reviewed with patient. Given AVS and prescription sent to his pharmacy by MD. Verbalized understanding of instructions, follow-up and post-op education. IV site d/c'd and site within normal limits. Denies pain since pain medication this am. Pt in stable condition awaiting wife's arrival for discharge home. Donavan Foil, RN

## 2018-04-08 LAB — URINE CULTURE

## 2018-04-13 ENCOUNTER — Encounter: Payer: Self-pay | Admitting: General Surgery

## 2018-04-13 ENCOUNTER — Ambulatory Visit (INDEPENDENT_AMBULATORY_CARE_PROVIDER_SITE_OTHER): Payer: Self-pay | Admitting: General Surgery

## 2018-04-13 VITALS — BP 120/72 | HR 66 | Temp 97.8°F | Resp 16 | Wt 173.6 lb

## 2018-04-13 DIAGNOSIS — Z09 Encounter for follow-up examination after completed treatment for conditions other than malignant neoplasm: Secondary | ICD-10-CM

## 2018-04-13 NOTE — Progress Notes (Signed)
Subjective:     Epimenio Foot  Status post laparoscopic appendectomy.  Doing well.  Has no complaints except for mild incisional pain. Objective:    BP 120/72 (BP Location: Left Arm, Patient Position: Sitting, Cuff Size: Normal)   Pulse 66   Temp 97.8 F (36.6 C)   Resp 16   Wt 173 lb 9.6 oz (78.7 kg)   BMI 27.19 kg/m   General:  alert, cooperative and no distress  Abdomen soft, incisions healing well.  Staples removed, Steri-Strips applied. Final pathology consistent with diagnosis.     Assessment:    Doing well postoperatively.    Plan:   May return to work without restrictions on 05/03/2018.  Follow-up here as needed.  Gradually increase activity.

## 2018-04-28 DIAGNOSIS — Z008 Encounter for other general examination: Secondary | ICD-10-CM | POA: Diagnosis not present

## 2018-05-17 DIAGNOSIS — L219 Seborrheic dermatitis, unspecified: Secondary | ICD-10-CM | POA: Diagnosis not present

## 2018-05-17 DIAGNOSIS — R972 Elevated prostate specific antigen [PSA]: Secondary | ICD-10-CM | POA: Diagnosis not present

## 2018-05-17 DIAGNOSIS — Z008 Encounter for other general examination: Secondary | ICD-10-CM | POA: Diagnosis not present

## 2018-05-17 DIAGNOSIS — Z719 Counseling, unspecified: Secondary | ICD-10-CM | POA: Diagnosis not present

## 2018-06-17 IMAGING — MR MR PROSTATE WO/W CM
23 of 56 series · 23 of 56 positions shown · IV contrast (yes)
Comparison: 01/20/2013

CLINICAL DATA: Elevated PSA level. Previous negative prostate
biopsy.

EXAM:
MR PROSTATE WITHOUT AND WITH CONTRAST
TECHNIQUE: Multiplanar multisequence MRI images were obtained of the pelvis
centered about the prostate. Pre and post contrast images were
obtained.
CONTRAST:  15 mL MultiHance

[Series 3: bSSFP fat-sat · axial · 6.0mm · 0.86mm/px · 1 of 44 slices shown]
[im 1/44]
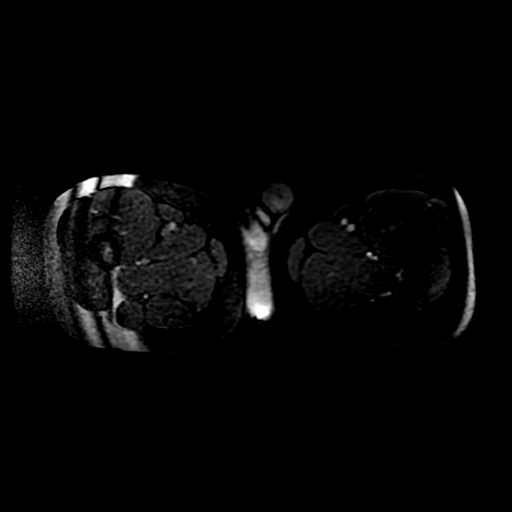

[Series 4: T1 · axial · 6.0mm · 0.86mm/px · 1 of 44 slices shown (1 of 2)]
[im 1/44]
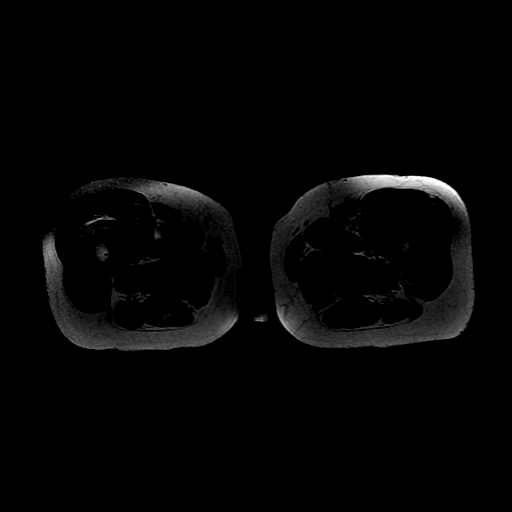

[Series 5: T2 · axial · 3.0mm · 0.29mm/px · 1 of 28 slices shown (1 of 5)]
[im 1/28]
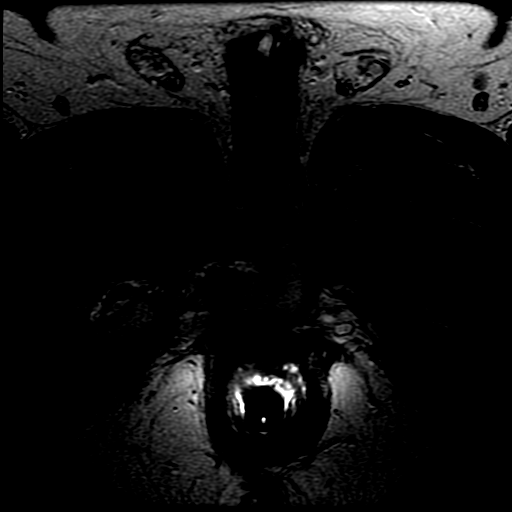

[Series 6: T1 · axial · 3.0mm · 0.29mm/px · 1 of 28 slices shown (2 of 2)]
[im 1/28]
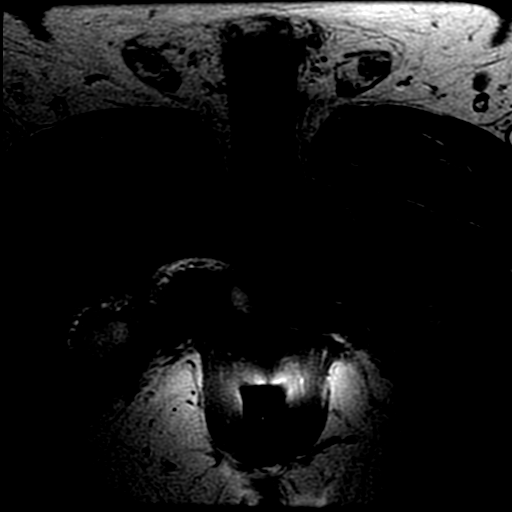

[Series 7: T2 · axial · 1.8mm · 0.47mm/px · 1 of 156 slices shown (2 of 5)]
[im 1/156]
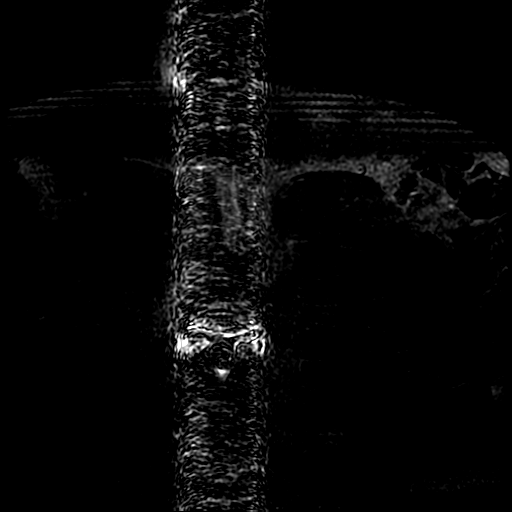

[Series 8: T2 · sagittal · 4.0mm · 0.29mm/px · 1 of 24 slices shown (3 of 5)]
[im 1/24]
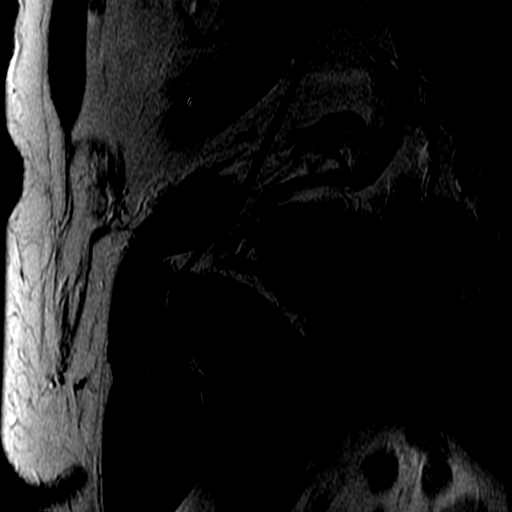

[Series 9: T2 · axial · 2.0mm · 0.47mm/px · 1 of 128 slices shown (4 of 5)]
[im 1/128]
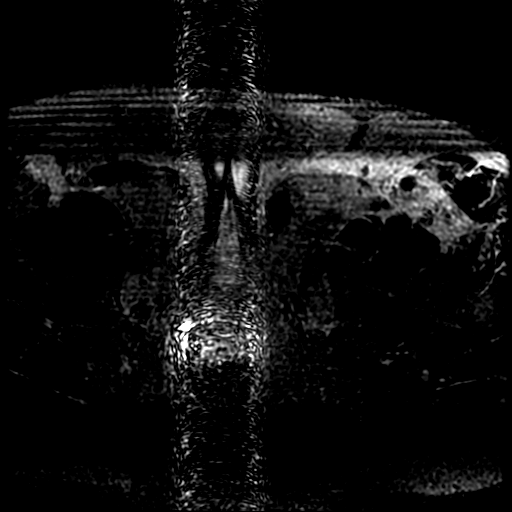

[Series 10: T2 · coronal · 4.0mm · 0.29mm/px · 1 of 20 slices shown (5 of 5)]
[im 1/20]
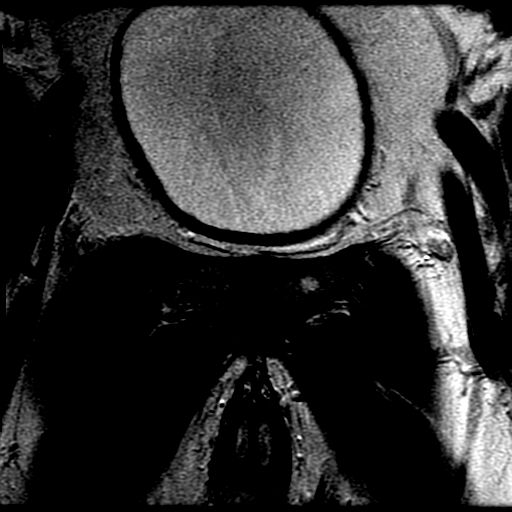

[Series 11: DWI · axial · 3.0mm · 0.59mm/px · 1 of 56 slices shown (1 of 6)]
[im 1/56]
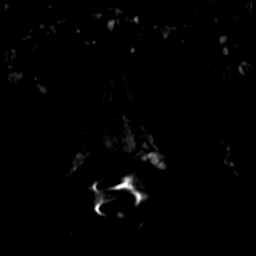

[Series 12: DWI · axial · 3.0mm · 0.59mm/px · 1 of 56 slices shown (2 of 6)]
[im 1/56]
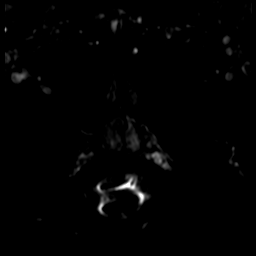

[Series 13: DWI · axial · 3.0mm · 0.59mm/px · 1 of 56 slices shown (3 of 6)]
[im 1/56]
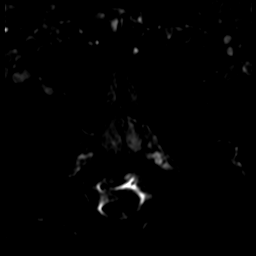

[Series 701: reformatted · sagittal · 2.0mm · 0.43mm/px · 1 of 125 slices shown]
[im 1/125]
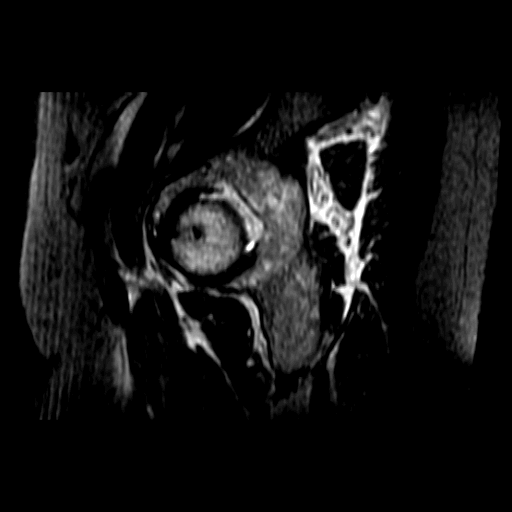

[Series 1100: DWI · axial · 3.0mm · 0.59mm/px · 1 of 28 slices shown (4 of 6)]
[im 1/28]
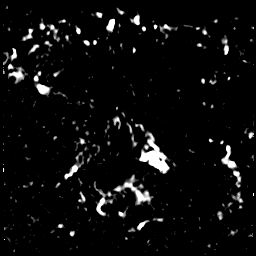

[Series 1200: DWI · axial · 3.0mm · 0.59mm/px · 1 of 28 slices shown (5 of 6)]
[im 1/28]
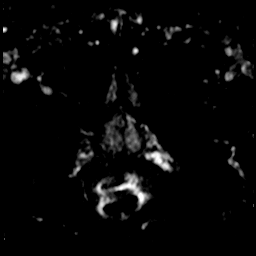

[Series 1300: DWI · axial · 3.0mm · 0.59mm/px · 1 of 28 slices shown (6 of 6)]
[im 1/28]
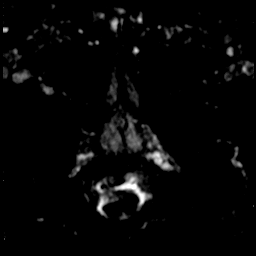

[((id)/(id)/1)-((id)/(id)/1) · axial · 3.0mm · 0.43mm/px · 1 of 63 slices shown (1 of 8)]
[im 1/63]
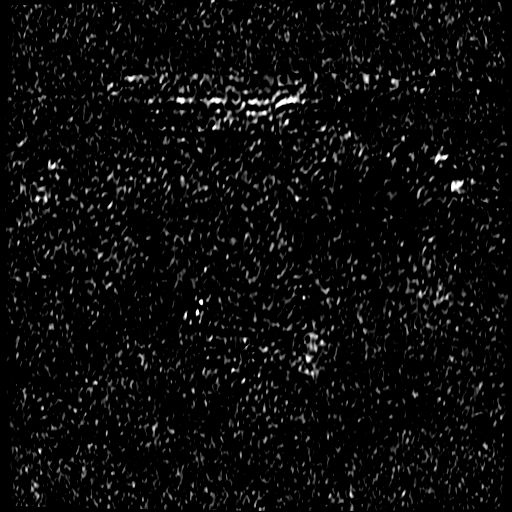

[((id)/(id)/1)-((id)/(id)/1) · axial · 3.0mm · 0.43mm/px · 1 of 73 slices shown (2 of 8)]
[im 1/73]
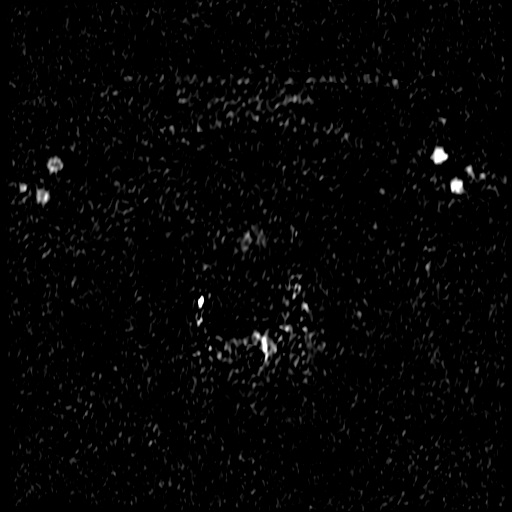

[((id)/(id)/1)-((id)/(id)/1) · axial · 3.0mm · 0.43mm/px · 1 of 65 slices shown (3 of 8)]
[im 1/65]
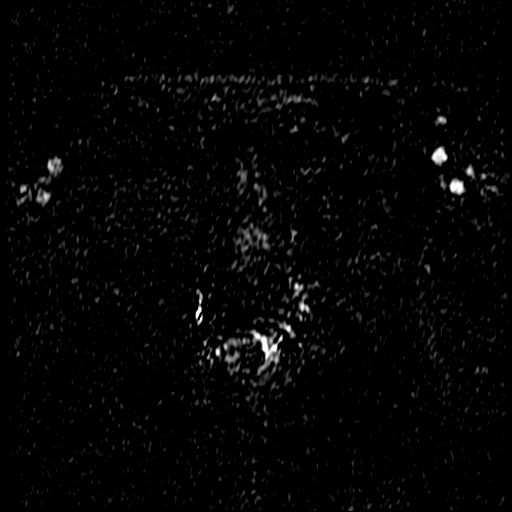

[((id)/(id)/1)-((id)/(id)/1) · axial · 3.0mm · 0.43mm/px · 1 of 66 slices shown (4 of 8)]
[im 1/66]
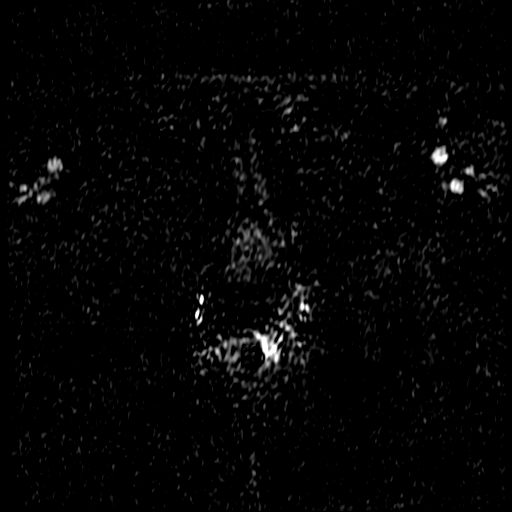

[((id)/(id)/1)-((id)/(id)/1) · axial · 3.0mm · 0.43mm/px · 1 of 66 slices shown (5 of 8)]
[im 1/66]
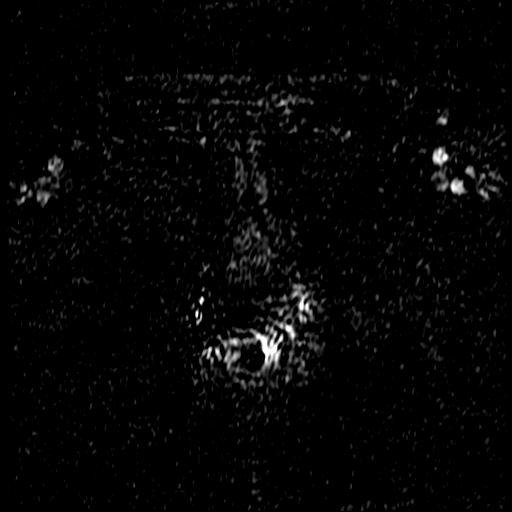

[((id)/(id)/1)-((id)/(id)/1) · axial · 3.0mm · 0.43mm/px · 1 of 66 slices shown (6 of 8)]
[im 1/66]
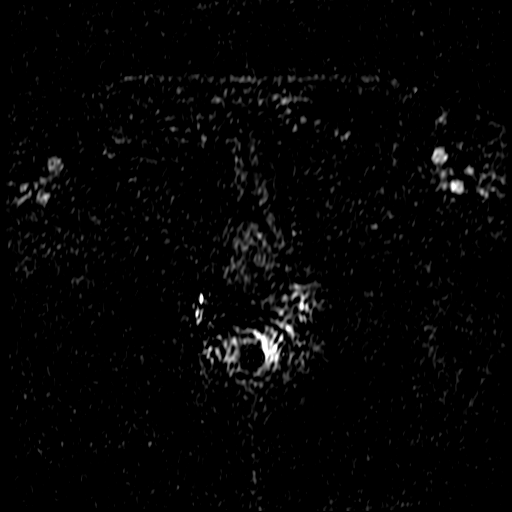

[((id)/(id)/1)-((id)/(id)/1) · axial · 3.0mm · 0.43mm/px · 1 of 66 slices shown (7 of 8)]
[im 1/66]
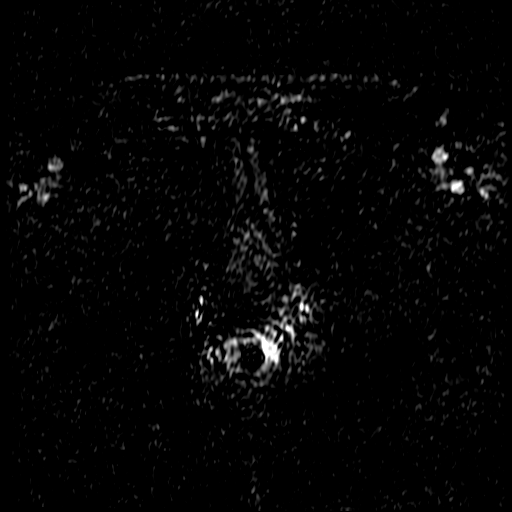

[((id)/(id)/1)-((id)/(id)/1) · axial · 3.0mm · 0.43mm/px · 1 of 66 slices shown (8 of 8)]
[im 1/66]
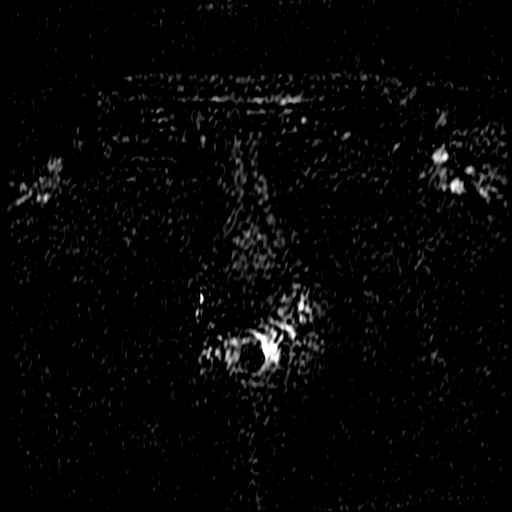

[23 of 56 positions shown; findings below may reference images not displayed]

FINDINGS: Prostate: Mildly enlarged. Central gland enlargement seen with
multiple BPH nodules. Moderate median lobe hypertrophy again seen
indenting the bladder base. No suspicious T2 hypointense central
gland nodules identified.

In the peripheral zone, there is a focal site of moderate ADC
hypointensity in the left posteromedial mid gland, on image
[DATE],300. This corresponds to a T2 hypointense nodule measuring 3 x
7 mm on image [DATE]. This shows no evidence of contrast enhancement
on dynamic imaging, and is stable in size compared to previous study
in 0185.

No other suspicious peripheral zone nodules identified.

Transcapsular spread:  Absent

Seminal vesicle involvement: Absent

Neurovascular bundle involvement: Absent

Pelvic adenopathy: Absent

Bone metastasis: Absent

Other findings: Mild diffuse bladder wall thickening, consistent
chronic bladder outlet obstruction.
IMPRESSION: 7 mm peripheral zone nodule in the left posteromedial mid gland
shows moderate ADC hypointensity, but no evidence of early dynamic
contrast enhancement. This remains stable since previous study in
0185. PI-RADS category 3.

No evidence of extracapsular extension or pelvic metastatic disease.

## 2018-07-16 DIAGNOSIS — M25569 Pain in unspecified knee: Secondary | ICD-10-CM | POA: Diagnosis not present

## 2018-07-16 DIAGNOSIS — L409 Psoriasis, unspecified: Secondary | ICD-10-CM | POA: Diagnosis not present

## 2018-07-26 DIAGNOSIS — Z719 Counseling, unspecified: Secondary | ICD-10-CM | POA: Diagnosis not present

## 2018-07-26 DIAGNOSIS — L219 Seborrheic dermatitis, unspecified: Secondary | ICD-10-CM | POA: Diagnosis not present

## 2018-07-26 DIAGNOSIS — R972 Elevated prostate specific antigen [PSA]: Secondary | ICD-10-CM | POA: Diagnosis not present

## 2018-07-26 DIAGNOSIS — Z008 Encounter for other general examination: Secondary | ICD-10-CM | POA: Diagnosis not present

## 2018-07-27 DIAGNOSIS — M25561 Pain in right knee: Secondary | ICD-10-CM | POA: Diagnosis not present

## 2018-08-26 ENCOUNTER — Encounter: Payer: Self-pay | Admitting: Nurse Practitioner

## 2018-08-26 ENCOUNTER — Ambulatory Visit: Payer: BLUE CROSS/BLUE SHIELD | Admitting: Nurse Practitioner

## 2018-08-26 VITALS — BP 112/57 | HR 78 | Temp 98.0°F | Ht 67.0 in | Wt 180.0 lb

## 2018-08-26 DIAGNOSIS — D860 Sarcoidosis of lung: Secondary | ICD-10-CM | POA: Diagnosis not present

## 2018-08-26 DIAGNOSIS — J984 Other disorders of lung: Secondary | ICD-10-CM | POA: Diagnosis not present

## 2018-08-26 DIAGNOSIS — J069 Acute upper respiratory infection, unspecified: Secondary | ICD-10-CM | POA: Diagnosis not present

## 2018-08-26 MED ORDER — HYDROCODONE-HOMATROPINE 5-1.5 MG/5ML PO SYRP: 5.0000 mL | ORAL_SOLUTION | Freq: Four times a day (QID) | ORAL | 0 refills | Status: DC | PRN

## 2018-08-26 MED ORDER — AZITHROMYCIN 250 MG PO TABS: ORAL_TABLET | ORAL | 0 refills | Status: DC

## 2018-08-26 NOTE — Patient Instructions (Signed)

## 2018-08-26 NOTE — Progress Notes (Signed)
Subjective:    Patient ID: William [name], male    DOB: May 16, 1951, 68 y.o.   MRN: [id number]   Chief Complaint: Cough   HPI Patient comes in c/o cough that has become productive and has turned green. Nasal congestion with facial pressure. Started I=on Tuesday. He has sarcoidosis and hois pulmonologist told him he need sto be seen when has cough.    Review of Systems  Constitutional: Negative for chills and fever.  HENT: Positive for congestion, sinus pressure and sinus pain. Negative for ear pain and sore throat.   Respiratory: Positive for cough (productive- greenish). Negative for shortness of breath.   Cardiovascular: Negative.   Gastrointestinal: Negative.   Musculoskeletal: Negative for myalgias.  Neurological: Negative for headaches.  Psychiatric/Behavioral: Negative.   All other systems reviewed and are negative.      Objective:   Physical Exam Vitals signs and nursing note reviewed.  Constitutional:      General: He is not in acute distress.    Appearance: He is normal weight.  HENT:     Right Ear: Hearing, tympanic membrane, ear canal and external ear normal.     Left Ear: Hearing, tympanic membrane, ear canal and external ear normal.     Nose: Mucosal edema, congestion and rhinorrhea present.     Right Turbinates: Swollen and pale.     Left Turbinates: Swollen and pale.     Right Sinus: No maxillary sinus tenderness or frontal sinus tenderness.     Left Sinus: No maxillary sinus tenderness or frontal sinus tenderness.     Mouth/Throat:     Lips: Pink.     Pharynx: Oropharynx is clear.  Neck:     Musculoskeletal: Normal range of motion and neck supple.  Cardiovascular:     Rate and Rhythm: Normal rate and regular rhythm.     Heart sounds: Normal heart sounds.  Pulmonary:     Effort: Pulmonary effort is normal.     Breath sounds: Normal breath sounds.     Comments: Dry cough  Lymphadenopathy:     Cervical: No cervical adenopathy.  Skin:    General:  Skin is warm and dry.  Neurological:     General: No focal deficit present.     Mental Status: He is alert and oriented to person, place, and time.  Psychiatric:        Mood and Affect: Mood normal.        Behavior: Behavior normal.    BP (!) 112/57   Pulse 78   Temp 98 F (36.7 C) (Oral)   Ht 5\' 7"  (1.702 m)   Wt 180 lb (81.6 kg)   BMI 28.19 kg/m         Assessment & Plan:  William [name] in today with chief complaint of Cough   1. Pulmonary sarcoidosis ([place])  2. Restrictive lung disease  3. Upper respiratory infection with cough and congestion 1. Take meds as prescribed 2. Use a cool mist humidifier especially during the winter months and when heat has been humid. 3. Use saline nose sprays frequently 4. Saline irrigations of the nose can be very helpful if done frequently.  * 4X daily for 1 week*  * Use of a nettie pot can be helpful with this. Follow directions with this* 5. Drink plenty of fluids 6. Keep thermostat turn down low 7.For any cough or congestion  Use plain Mucinex- regular strength or max strength is fine   * Children- consult with  Pharmacist for dosing 8. For fever or aces or pains- take tylenol or ibuprofen appropriate for age and weight.  * for fevers greater than 101 orally you may alternate ibuprofen and tylenol every  3 hours.    - HYDROcodone-homatropine (HYCODAN) 5-1.5 MG/5ML syrup; Take 5 mLs by mouth every 6 (six) hours as needed for cough.  Dispense: 120 mL; Refill: 0 - azithromycin (ZITHROMAX Z-PAK) 250 MG tablet; As directed  Dispense: 6 tablet; Refill: 0  Mary-Margaret Hassell Done, FNP

## 2018-09-10 DIAGNOSIS — E559 Vitamin D deficiency, unspecified: Secondary | ICD-10-CM | POA: Diagnosis not present

## 2018-09-10 DIAGNOSIS — E538 Deficiency of other specified B group vitamins: Secondary | ICD-10-CM | POA: Diagnosis not present

## 2018-09-10 DIAGNOSIS — L219 Seborrheic dermatitis, unspecified: Secondary | ICD-10-CM | POA: Diagnosis not present

## 2018-09-20 ENCOUNTER — Other Ambulatory Visit: Payer: Self-pay

## 2018-09-20 ENCOUNTER — Other Ambulatory Visit: Payer: Self-pay | Admitting: Family Medicine

## 2018-09-20 ENCOUNTER — Telehealth (INDEPENDENT_AMBULATORY_CARE_PROVIDER_SITE_OTHER): Payer: BLUE CROSS/BLUE SHIELD | Admitting: Family Medicine

## 2018-09-20 ENCOUNTER — Telehealth: Payer: Self-pay | Admitting: Pulmonary Disease

## 2018-09-20 DIAGNOSIS — D860 Sarcoidosis of lung: Secondary | ICD-10-CM

## 2018-09-20 MED ORDER — PREDNISONE 10 MG PO TABS: ORAL_TABLET | ORAL | 0 refills | Status: DC

## 2018-09-20 NOTE — Progress Notes (Signed)
   Virtual Visit via telephone Note  I connected with Epimenio Foot on 09/20/18 at 1:15 by telephone and verified that I am speaking with the correct person using two identifiers. William [name] is currently located at home.The provider, Claretta Fraise, MD is located in their office at time of visit.  I discussed the limitations, risks, seurity and privacy concerns of performing an evaluation and management service by telephone and the availability of in person appointments. I also discussed with the patient that there may be a patient responsible charge related to this service. The patient expressed understanding and agreed to proceed.   History and Present Illness:    Cough is slight. Much better than 4 weks ago when seen in the office. Comes and goes. Some dyspnea and chest tightness starting yesterday.  Worse today. Denies fever. Dry cough. No UR sx. Denies known exposure to [place].  Felt better with zpack.      Observations/Objective:  Pt has no apparent distress based on phone demeanor. 1. Pulmonary sarcoidosis ([place])     Meds ordered this encounter  Medications  . predniSONE (DELTASONE) 10 MG tablet    Sig: Take 5 daily for 2 days followed by 4,3,2 and 1 for 2 days each.    Dispense:  30 tablet    Refill:  0    Follow up as needed.  Claretta Fraise, MD    Follow Up Instructions:     I discussed the assessment and treatment plan with the patient. The patient was provided an opportunity to ask questions and all were answered. The patient agreed with the plan and demonstrated an understanding of the instructions.   The patient was advised to call back or seek an in-person evaluation if the symptoms worsen or if the condition fails to improve as anticipated.  The above assessment and management plan was discussed with the patient. The patient verbalized understanding of and has agreed to the management plan. Patient is aware to call the clinic if symptoms persist or  worsen. Patient is aware when to return to the clinic for a follow-up visit. Patient educated on when it is appropriate to go to the emergency department.    I provided 8 minutes of non-face-to-face time during this encounter. The call ended at 1:23 PM.    Claretta Fraise, MD

## 2018-09-20 NOTE — Telephone Encounter (Signed)
Unable to reach LMTCB 

## 2018-09-21 NOTE — Telephone Encounter (Signed)
Called and spoke with pt in regards to his current appt scheduled with RA. Stated to pt that we could keep his appt date but do a virtual OV instead where one of our APPs will call him and do the visit over the phone with him. Pt expressed understanding and stated that would be fine. Virtual OV scheduled for pt with Derl Barrow, NP 09/23/2018 at 10:30. Nothing further needed.

## 2018-09-23 ENCOUNTER — Encounter: Payer: Self-pay | Admitting: Primary Care

## 2018-09-23 ENCOUNTER — Other Ambulatory Visit: Payer: Self-pay

## 2018-09-23 ENCOUNTER — Ambulatory Visit: Payer: BLUE CROSS/BLUE SHIELD | Admitting: Pulmonary Disease

## 2018-09-23 ENCOUNTER — Other Ambulatory Visit: Payer: BLUE CROSS/BLUE SHIELD

## 2018-09-23 ENCOUNTER — Ambulatory Visit (INDEPENDENT_AMBULATORY_CARE_PROVIDER_SITE_OTHER): Payer: BLUE CROSS/BLUE SHIELD | Admitting: Primary Care

## 2018-09-23 DIAGNOSIS — D860 Sarcoidosis of lung: Secondary | ICD-10-CM | POA: Diagnosis not present

## 2018-09-23 NOTE — Patient Instructions (Addendum)
Thank you for allowing me to speak with you today for E-visit   Pulmonary sarcoidosis - Recent flare d/t environmental trigger  - Continue prednisone taper per PCP  - FU with Dr. Elsworth Soho in 4 months (July/August) for annual visit with CXR prior to monitor sarcoidosis     Sarcoidosis  Sarcoidosis is a disease that can cause inflammation in many areas of the body. It most often affects the lungs (pulmonary sarcoidosis). Sarcoidosis can also affect the lymph nodes, liver, eyes, skin, heart, or any other body tissue. Normally, cells that are part of your body's disease-fighting system (immune system) attack harmful substances (such as germs) in your body. This immune system response causes inflammation. After the harmful substance is destroyed, the inflammation and the immune cells go away. When you have sarcoidosis, your immune system causes inflammation even when there are no harmful substances, and the inflammation does not go away. Sarcoidosis also causes cells from your immune system to form small clumps of tissue (granulomas) in the affected area of your body. What are the causes? The exact cause of sarcoidosis is not known.  It is possible that if you have a family history of this disease (genetic predisposition), the immune system response that leads to inflammation may be triggered by something in your environment, such as:  Bacteria or viruses.  Metals.  Chemicals.  Dust.  Mold or mildew. What increases the risk? You may be at a greater risk for sarcoidosis if you:  Have a family history of the disease.  Are African-American.  Are of Northern European descent.  Are 40-62 years old.  Work as a Airline pilot.  Work in an environment where you are exposed to metals, chemicals, mold or mildew, or insecticides. What are the signs or symptoms? Some people with sarcoidosis have no symptoms. Others have very mild symptoms. The symptoms usually depend on the organ that is affected.  Sarcoidosis most often affects the lungs, which may include symptoms such as:  Chest pain.  Coughing.  Wheezing.  Shortness of breath. Other common symptoms include:  Night sweats.  Fever.  Weight loss.  Fatigue.  Swollen lymph nodes.  Joint pain. How is this diagnosed? Sarcoidosis may be diagnosed based on:  Your symptoms and medical history.  A physical exam.  Imaging tests to check for granulomas such as: ? Chest X-ray. ? CT scan. ? MRI. ? PET scan.  Lung function tests. These tests evaluate your breathing and check for problems that may be related to sarcoidosis.  A procedure to remove a tissue sample for testing (biopsy). You may have a biopsy of lung tissue if that is where you are having symptoms. You may have tests to check for any complications of the condition. These tests may include:  Eye exams.  MRI of the heart or brain.  Echocardiogram.  Electrocardiogram (EKG or ECG). How is this treated? In some cases, sarcoidosis does not require a specific treatment because it causes no symptoms or mild symptoms. If your symptoms bother you or are severe, you may be prescribed medicines to reduce inflammation or relieve symptoms. These medicines may include:  Prednisone. This is a steroid that reduces inflammation related to sarcoidosis.  Hydroxychloroquine. This may be used to treat sarcoidosis that affects the skin, eyes, or brain.  Methotrexate, leflunomide, or azathioprine. These medicines affect the immune system and can help with sarcoidosis in the joints, eyes, skin, or lungs.  Medicines that you breathe in (inhalers). Inhalers can help you breathe if sarcoidosis affects  your lungs. Follow these instructions at home:   Do not use any products that contain nicotine or tobacco, such as cigarettes and e-cigarettes. If you need help quitting, ask your health care provider.  Avoid secondhand smoke and irritating dust or chemicals. Stay indoors on days  when air quality is poor in your area.  Return to your normal activities as told by your health care provider. Ask your health care provider what activities are safe for you.  Take or use over-the-counter and prescription medicines only as told by your health care provider.  Keep all follow-up visits as told by your health care provider. This is important. Contact a health care provider if:  You have vision problems.  You have a dry cough that does not go away.  You have an irregular heartbeat.  You have pain or aches in your joints, hands, or feet.  You have an unexplained rash. Get help right away if:  You have chest pain.  You have difficulty breathing. Summary  Sarcoidosis is a disease that can cause inflammation in many body areas of the body. It most often affects the lungs (pulmonary sarcoidosis). It can also affect the lymph nodes, liver, eyes, skin, heart, or any other body tissue.  When you have sarcoidosis, cells from your immune system form small clumps of tissue (granulomas) in the affected area of your body.  Sarcoidosis sometimes does not require a specific treatment because it causes no symptoms or mild symptoms.  If your symptoms bother you or are severe, you may be prescribed medicines to reduce inflammation or relieve symptoms. This information is not intended to replace advice given to you by your health care provider. Make sure you discuss any questions you have with your health care provider. Document Released: 04/16/2004 Document Revised: 03/24/2017 Document Reviewed: 03/24/2017 Elsevier Interactive Patient Education  2019 [place] (COVID-19) Are you at risk?  Are you at risk for the Coronavirus (COVID-19)?  To be considered HIGH RISK for Coronavirus (COVID-19), you have to meet the following criteria:  . Traveled to [place], [place], [place], [place] or [place]; or in the [place] to [place], [place], [place], or [place]; and have fever, cough, and shortness of breath within the last 2 weeks of travel OR . Been in close contact with a person diagnosed with COVID-19 within the last 2 weeks and have fever, cough, and shortness of breath . IF YOU DO NOT MEET THESE CRITERIA, YOU ARE CONSIDERED LOW RISK FOR COVID-19.  What to do if you are HIGH RISK for COVID-19?  Marland Kitchen If you are having a medical emergency, call 911. . Seek medical care right away. Before you go to a doctor's office, urgent care or emergency department, call ahead and tell them about your recent travel, contact with someone diagnosed with COVID-19, and your symptoms. You should receive instructions from your physician's office regarding next steps of care.  . When you arrive at healthcare provider, tell the healthcare staff immediately you have returned from visiting [place], [place], [place], [place] or [place]; or traveled in the [place] to [place], [place], [place], or [place]; in the last two weeks or you have been in close contact with a person diagnosed with COVID-19 in the last 2 weeks.   . Tell the health care staff about your symptoms: fever, cough and shortness of breath. . After you have been seen by a medical provider, you will be either: o Tested  for (COVID-19) and discharged home on quarantine except to seek medical care if symptoms worsen, and asked to  - Stay home and avoid contact with others until you get your results (4-5 days)  - Avoid travel on public transportation if possible (such as bus, train, or airplane) or o Sent to the Emergency Department by EMS for evaluation, COVID-19 testing, and possible admission depending on your condition and test results.  What to do if you are LOW RISK for COVID-19?  Reduce your risk of any infection by using the same precautions used for avoiding the common cold or flu:  Marland Kitchen Wash your hands often with soap and warm water for at least 20 seconds.  If soap and water are not readily  available, use an alcohol-based hand sanitizer with at least 60% alcohol.  . If coughing or sneezing, cover your mouth and nose by coughing or sneezing into the elbow areas of your shirt or coat, into a tissue or into your sleeve (not your hands). . Avoid shaking hands with others and consider head nods or verbal greetings only. . Avoid touching your eyes, nose, or mouth with unwashed hands.  . Avoid close contact with people who are sick. . Avoid places or events with large numbers of people in one location, like concerts or sporting events. . Carefully consider travel plans you have or are making. . If you are planning any travel outside or inside the [place], visit the CDC's Travelers' Health webpage for the latest health notices. . If you have some symptoms but not all symptoms, continue to monitor at home and seek medical attention if your symptoms worsen. . If you are having a medical emergency, call 911.   [place] / e-Visit: [email or web address]         [facility]: [facility]: [phone]                   [facility]: [phone]

## 2018-09-23 NOTE — Progress Notes (Signed)
Virtual Visit via Telephone Note  I connected with William [name] on 09/23/18 at 10:30 AM EDT by telephone and verified that I am speaking with the correct person using two identifiers.   I discussed the limitations, risks, security and privacy concerns of performing an evaluation and management service by telephone and the availability of in person appointments. I also discussed with the patient that there may be a patient responsible charge related to this service. The patient expressed understanding and agreed to proceed.  Patient at home, agreeing to E-vist. Myself and [name] present on phone call. My nurse lisa to set up follow-up and mail AVS.  History of Present Illness: 68 year old male, never smoked. PMH significant for pulmonary sarcoidosis, restrictive lung disease, chronic seasonal allergic rhinitis. Patient of Dr. Elsworth Soho, last seen on 11/18/17.   Patient called today for regular follow-up. He was seen on 2/27 by primary care FNP for cough with green mucus. Given zpack and Hycodan cough syrup. He had a follow-up E-vist with Dr. Livia Snellen on 3/23 and prescribed prednisone taper. States that his cough is pretty much gone and breathing is getting better. 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 States that his sarcoidosis symptoms were well controlled prior to his flare. Denies fever.   Observations/Objective:  - Mild dyspnea. No wheezing or cough observed during phone conversation. He is able to speak in full sentences.   Assessment and Plan:  Pulmonary sarcoidosis - Recent flare d/t environmental trigger  - Continue prednisone taper per PCP  - FU in 4 months for annual visit with CXR prior to monitor sarcoidosis   Follow Up Instructions:  - FU in 4 months with annual visit with Dr. Elsworth Soho - Obtain CXR prior to monitor sarcoidosis     I discussed the assessment and treatment plan with the patient. The patient was provided an opportunity  to ask questions and all were answered. The patient agreed with the plan and demonstrated an understanding of the instructions.   The patient was advised to call back or seek an in-person evaluation if the symptoms worsen or if the condition fails to improve as anticipated.  I provided 12 minutes of non-face-to-face time during this encounter. Call started 10:45 and ended 10:57.    Martyn Ehrich, NP

## 2018-09-29 ENCOUNTER — Telehealth: Payer: Self-pay | Admitting: Pulmonary Disease

## 2018-09-29 NOTE — Telephone Encounter (Signed)
Attempted to call pt but unable to reach. Left message for pt to return call. 

## 2018-09-30 NOTE — Telephone Encounter (Signed)
LMTCB x2 for pt 

## 2018-10-01 NOTE — Telephone Encounter (Signed)
LMTCB x3 for pt. We have attempted to contact pt several times with no success or call back from pt. Per triage protocol, message will be closed.   

## 2018-10-24 ENCOUNTER — Encounter: Payer: Self-pay | Admitting: General Surgery

## 2019-01-19 DIAGNOSIS — Z008 Encounter for other general examination: Secondary | ICD-10-CM | POA: Diagnosis not present

## 2019-01-19 DIAGNOSIS — Z719 Counseling, unspecified: Secondary | ICD-10-CM | POA: Diagnosis not present

## 2019-01-19 DIAGNOSIS — R972 Elevated prostate specific antigen [PSA]: Secondary | ICD-10-CM | POA: Diagnosis not present

## 2019-01-19 DIAGNOSIS — L219 Seborrheic dermatitis, unspecified: Secondary | ICD-10-CM | POA: Diagnosis not present

## 2019-02-23 ENCOUNTER — Encounter: Payer: Self-pay | Admitting: Pulmonary Disease

## 2019-02-23 ENCOUNTER — Ambulatory Visit (INDEPENDENT_AMBULATORY_CARE_PROVIDER_SITE_OTHER): Payer: BC Managed Care – PPO

## 2019-02-23 ENCOUNTER — Other Ambulatory Visit: Payer: Self-pay

## 2019-02-23 ENCOUNTER — Ambulatory Visit: Payer: BLUE CROSS/BLUE SHIELD | Admitting: Pulmonary Disease

## 2019-02-23 DIAGNOSIS — D869 Sarcoidosis, unspecified: Secondary | ICD-10-CM | POA: Diagnosis not present

## 2019-02-23 DIAGNOSIS — Z79899 Other long term (current) drug therapy: Secondary | ICD-10-CM | POA: Diagnosis not present

## 2019-02-23 DIAGNOSIS — D860 Sarcoidosis of lung: Secondary | ICD-10-CM

## 2019-02-23 DIAGNOSIS — E785 Hyperlipidemia, unspecified: Secondary | ICD-10-CM | POA: Diagnosis not present

## 2019-02-23 DIAGNOSIS — E559 Vitamin D deficiency, unspecified: Secondary | ICD-10-CM | POA: Diagnosis not present

## 2019-02-23 DIAGNOSIS — Z139 Encounter for screening, unspecified: Secondary | ICD-10-CM | POA: Diagnosis not present

## 2019-02-23 DIAGNOSIS — J984 Other disorders of lung: Secondary | ICD-10-CM | POA: Diagnosis not present

## 2019-02-23 DIAGNOSIS — Z23 Encounter for immunization: Secondary | ICD-10-CM

## 2019-02-23 DIAGNOSIS — R972 Elevated prostate specific antigen [PSA]: Secondary | ICD-10-CM | POA: Diagnosis not present

## 2019-02-23 NOTE — Assessment & Plan Note (Signed)
Appears stable by symptoms and chest x-ray No treatment required, and will follow-up in 1 year. Convinced him to get flu shot today.  Okay to start on medications for psoriasis if needed

## 2019-02-23 NOTE — Assessment & Plan Note (Signed)
Has been stable, will defer PFTs for next year

## 2019-02-23 NOTE — Progress Notes (Signed)
   Subjective:    Patient ID: William [name], male    DOB: 05-Jan-1951, 68 y.o.   MRN: [id number]  HPI  68 yo never smoker with sarcoidosis presents to establish care and for annual follow-up. He was diagnosed in 2015 when he presented with masslike consolidation in the left upper lobe and hilar lymphadenopathy and underwent bronchoscopic biopsy.  Chief Complaint  Patient presents with  . Follow-up    sarcoiodosis with chest xray   He had bronchitis-like symptoms and had a tele-visit 08/2018, received a prednisone taper Since then, his symptoms have resolved. He denies shortness of breath, cough or wheezing.  He has been social distancing and wearing a mask.  He does have psoriatic skin lesions and arthritis and wonders if treatment would impact the lungs.  Chest x-ray today was reviewed which does not show any new infiltrates, mild left upper lobe scarring is noted before compared to 2019    Significant tests/ events reviewed  PFT 10/2017 PFT >> ratio 76, FEV1 65%, FVC 64%, TLC 83%, DLCO 76%  11/14/16:FVC 2.98 L (66%) FEV1 2.07L(62%) FEV1/FVC 0.70  negative bronchodilator response TLC 5.20 L (76%) RV 92% ERV 116% DLCO corrected 72%  11/07/15: FVC 3.22 L (71%) FEV1 2.28 L (67%) FEV1/FVC 0.71  negative bronchodilator response TLC 4.27 L (62%) RV 22% ERV 215% DLCO corrected 84%   CT CHEST W/O 01/04/14: No pleural effusion or thickening. Reduce size of left upper lobe consolidation/mass. Diffuse peri-bronchovascular nodularity. Right hilar lymph node measuring approximately 2 cm. No other nodule or mass appreciated. No pericardial effusion. Patulous esophagus.   PATHOLOGY BRUSH LUL (09/14/13): Reactive bronchial epithelial cells with fragments of granuloma. No atypia or malignancy. BAL LUL (09/14/13): Rare atypical cells. Abundant histiocytes. Acute and chronic inflammation. FNA & TBBx LUL (09/14/13): Chronic nonnecrotizing granulomatous inflammation with granulomata. AFB, GMS,  & PAS stains negative. No atypia or malignancy  Review of Systems  neg for any significant sore throat, dysphagia, itching, sneezing, nasal congestion or excess/ purulent secretions, fever, chills, sweats, unintended wt loss, pleuritic or exertional cp, hempoptysis, orthopnea pnd or change in chronic leg swelling. Also denies presyncope, palpitations, heartburn, abdominal pain, nausea, vomiting, diarrhea or change in bowel or urinary habits, dysuria,hematuria, rash, arthralgias, visual complaints, headache, numbness weakness or ataxia.     Objective:   Physical Exam   Gen. Pleasant, well-nourished, in no distress ENT - no thrush, no pallor/icterus,no post nasal drip Neck: No JVD, no thyromegaly, no carotid bruits Lungs: no use of accessory muscles, no dullness to percussion, clear without rales or rhonchi  Cardiovascular: Rhythm regular, heart sounds  normal, no murmurs or gallops, no peripheral edema Musculoskeletal: No deformities, no cyanosis or clubbing  Skin- lesions of psoriasis       Assessment & Plan:

## 2019-02-23 NOTE — Patient Instructions (Signed)
  Sarcoidosis appears stable. Flu shot today

## 2019-03-02 DIAGNOSIS — R972 Elevated prostate specific antigen [PSA]: Secondary | ICD-10-CM | POA: Diagnosis not present

## 2019-03-02 DIAGNOSIS — Z79899 Other long term (current) drug therapy: Secondary | ICD-10-CM | POA: Diagnosis not present

## 2019-03-02 DIAGNOSIS — E785 Hyperlipidemia, unspecified: Secondary | ICD-10-CM | POA: Diagnosis not present

## 2019-03-08 DIAGNOSIS — N401 Enlarged prostate with lower urinary tract symptoms: Secondary | ICD-10-CM | POA: Diagnosis not present

## 2019-03-08 DIAGNOSIS — R350 Frequency of micturition: Secondary | ICD-10-CM | POA: Diagnosis not present

## 2019-03-14 DIAGNOSIS — R972 Elevated prostate specific antigen [PSA]: Secondary | ICD-10-CM | POA: Diagnosis not present

## 2019-03-14 DIAGNOSIS — R350 Frequency of micturition: Secondary | ICD-10-CM | POA: Diagnosis not present

## 2019-03-14 DIAGNOSIS — N401 Enlarged prostate with lower urinary tract symptoms: Secondary | ICD-10-CM | POA: Diagnosis not present

## 2019-03-29 NOTE — Progress Notes (Signed)
Office Visit Note  Patient: William [name]             Date of Birth: [birth year]           MRN: [id number]             PCP: Claretta Fraise, MD Referring: Shaune Pollack, FNP Visit Date: 04/12/2019 Occupation: Instructor  Subjective:  Psoriasis, hip pain, pulmonary sarcoidosis.   History of Present Illness: William [name] is a 68 y.o. male seen in consultation per request of Ms. Shaune Pollack.  According to patient he has had psoriasis since he was a child.  He saw a dermatologist several years ago and has not used any topical agents.  Recently his psoriasis has been flaring on his extremities and his face.  He states he was also diagnosed with sarcoidosis 4 years ago when he developed shortness of breath.  He was initially treated with prednisone by Dr. Elsworth Soho.  He did not require any other DMARDs.  He states Dr. Elsworth Soho felt his pulmonary sarcoidosis is quiet.  He also has off-and-on bilateral hip joint pain.  Which is mostly in the morning.  None of the other joints are painful.  He denies any joint swelling.  There is no family history of psoriasis.  Activities of Daily Living:  Patient reports morning stiffness for 5-10 minutes.   Patient Denies nocturnal pain.  Difficulty dressing/grooming: Denies Difficulty climbing stairs: Denies Difficulty getting out of chair: Denies Difficulty using hands for taps, buttons, cutlery, and/or writing: Reports  Review of Systems  Constitutional: Positive for fatigue. Negative for night sweats.  HENT: Negative for mouth sores, mouth dryness and nose dryness.   Eyes: Negative for redness, itching and dryness.  Respiratory: Negative for shortness of breath, wheezing and difficulty breathing.   Cardiovascular: Negative for chest pain, palpitations, hypertension, irregular heartbeat and swelling in legs/feet.  Gastrointestinal: Negative for abdominal pain, blood in stool, constipation and diarrhea.  Endocrine: Negative for increased urination.   Genitourinary: Negative for difficulty urinating and painful urination.  Musculoskeletal: Positive for arthralgias, joint pain and morning stiffness. Negative for joint swelling, myalgias, muscle weakness, muscle tenderness and myalgias.  Skin: Positive for rash. Negative for color change, hair loss, nodules/bumps, skin tightness, ulcers and sensitivity to sunlight.  Allergic/Immunologic: Negative for susceptible to infections.  Neurological: Negative for dizziness, fainting, light-headedness, numbness, headaches, memory loss, night sweats and weakness.  Hematological: Positive for bruising/bleeding tendency. Negative for swollen glands.  Psychiatric/Behavioral: Negative for depressed mood, confusion and sleep disturbance. The patient is not nervous/anxious.     PMFS History:  Patient Active Problem List   Diagnosis Date Noted  . S/P laparoscopic appendectomy 04/06/2018  . Acute appendicitis, uncomplicated   . Restrictive lung disease 11/14/2016  . Chronic seasonal allergic rhinitis 11/14/2016  . Psoriasis 04/27/2014  . Pulmonary sarcoidosis ([place]) 09/05/2013    Past Medical History:  Diagnosis Date  . Hyperlipidemia   . Sarcoidosis 08/2013   Diagnosed with bronchoscopy. Pulmonary Involvement only.     Family History  Problem Relation Age of Onset  . Allergies Father   . Heart disease Father   . Lung disease Neg Hx   . Rheumatologic disease Neg Hx    Past Surgical History:  Procedure Laterality Date  . CHOLECYSTECTOMY    . COLONOSCOPY    . LAPAROSCOPIC APPENDECTOMY N/A 04/06/2018   Procedure: APPENDECTOMY LAPAROSCOPIC;  Surgeon: Aviva Signs, MD;  Location: [facility];  Service: General;  Laterality: N/A;  . VIDEO BRONCHOSCOPY Bilateral 09/14/2013  Procedure: VIDEO BRONCHOSCOPY WITH FLUORO;  Surgeon: Kathee Delton, MD;  Location: [facility];  Service: Cardiopulmonary;  Laterality: Bilateral;   Social History   Social History Narrative   Originally from [place]. Always lived in  [place]. Previously traveled to [place]. No international travel. No pets currently. No bird, mold, or hot tub exposure. 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    Immunization History  Administered Date(s) Administered  . Influenza Split 03/21/2017  . Influenza,inj,Quad PF,6+ Mos 04/21/2013, 04/17/2017, 02/23/2019  . Influenza-Unspecified 03/30/2014, 04/10/2015, 04/08/2016, 03/23/2018  . Pneumococcal Conjugate-13 11/14/2016  . Pneumococcal Polysaccharide-23 09/07/2015     Objective: Vital Signs: BP 123/75 (BP Location: Right Arm, Patient Position: Sitting, Cuff Size: Normal)   Pulse 62   Resp 15   Ht 5\' 8"  (1.727 m)   Wt 174 lb 9.6 oz (79.2 kg)   BMI 26.55 kg/m    Physical Exam Vitals signs and nursing note reviewed.  Constitutional:      Appearance: He is well-developed.  HENT:     Head: Normocephalic and atraumatic.  Eyes:     Conjunctiva/sclera: Conjunctivae normal.     Pupils: Pupils are equal, round, and reactive to light.  Neck:     Musculoskeletal: Normal range of motion and neck supple.  Cardiovascular:     Rate and Rhythm: Normal rate and regular rhythm.     Heart sounds: Normal heart sounds.  Pulmonary:     Effort: Pulmonary effort is normal.     Breath sounds: Normal breath sounds.  Abdominal:     General: Bowel sounds are normal.     Palpations: Abdomen is soft.  Skin:    General: Skin is warm and dry.     Capillary Refill: Capillary refill takes less than 2 seconds.  Neurological:     Mental Status: He is alert and oriented to person, place, and time.  Psychiatric:        Behavior: Behavior normal.      Musculoskeletal Exam: Patient has limited range of motion of her cervical spine.  Thoracic and lumbar spine were in good range of motion.  He had no SI joint tenderness.  Shoulder joints were in good range of motion.  His right elbow joint contracture without much tenderness  on palpation.  Wrist joints were in good range of motion.  He has some DIP and PIP thickening but no synovitis was noted.  No nail pitting was noted.  He had some discomfort range of motion of his knee joints but the range of motion was good.  Knee joints, ankle joints, MTPs and PIPs with good range of motion.  There was no evidence of Achilles tendinitis, plantar fasciitis or dactylitis.  CDAI Exam: CDAI Score: - Patient Global: -; Provider Global: - Swollen: -; Tender: - Joint Exam   No joint exam has been documented for this visit   There is currently no information documented on the homunculus. Go to the Rheumatology activity and complete the homunculus joint exam.  Investigation: No additional findings.  Imaging: Xr Hips Bilat W Or W/o Pelvis 3-4 Views  Result Date: 04/12/2019 No SI joint changes were noted.  Calcification in the hip joint capsule was noted on the left hip.  Inferior spurring of the right hip joint was noted.  Mild sclerosis was noted. Impression: These findings are consistent with early mild osteoarthritis of hip joints.  Xr Elbow 2 Views Right  Result Date: 04/12/2019 Severe elbow joint narrowing was noted.  Spurring was noted.  No erosive changes were noted.  Impression: Severe arthritis of the elbow joint.  Xr Hand 2 View Left  Result Date: 04/12/2019 Severe CMC narrowing was noted.  PIP and DIP narrowing was noted.  No erosive changes were noted.  Mild narrowing of the second and third MCP joint was noted.  No metacarpocarpal intercarpal or radiocarpal joint space narrowing was noted. Impression: These findings are consistent with osteoarthritis of the hand.  Xr Hand 2 View Right  Result Date: 04/12/2019 Severe CMC narrowing was noted.  PIP and DIP narrowing was noted.  No erosive changes were noted.  Mild narrowing of the second and third MCP joint was noted.  No metacarpocarpal intercarpal or radiocarpal joint space narrowing was noted. Impression: These  findings are consistent with osteoarthritis of the hand.   Recent Labs: Lab Results  Component Value Date   WBC 10.4 04/07/2018   HGB 13.5 04/07/2018   PLT 170 04/07/2018   NA 135 04/07/2018   K 3.9 04/07/2018   CL 103 04/07/2018   CO2 25 04/07/2018   GLUCOSE 120 (H) 04/07/2018   BUN 17 04/07/2018   CREATININE 1.04 04/07/2018   BILITOT 1.9 (H) 04/06/2018   ALKPHOS 75 04/06/2018   AST 23 04/06/2018   ALT 19 04/06/2018   PROT 8.4 (H) 04/06/2018   ALBUMIN 4.5 04/06/2018   CALCIUM 8.0 (L) 04/07/2018   GFRAA >60 04/07/2018   February 24, 2019 CBC normal, CMP normal, vitamin D normal  Speciality Comments: No specialty comments available.  Procedures:  No procedures performed Allergies: Patient has no known allergies.   Assessment / Plan:     Visit Diagnoses: Pulmonary sarcoidosis ([place]) - Managed by Dr. Elsworth Soho.  Patient states his symptoms started about 4 years ago and was initially treated with prednisone.  He has been off prednisone for many years.  He states Dr. Gwenlyn Perking believes that his sarcoidosis is in remission.  He had a chest x-ray recently which showed some scarring but no active disease.  Restrictive lung disease-followed by Dr. Elsworth Soho.  Psoriasis-he has psoriasis since childhood.  He states his psoriasis is flaring now on his extremities and face.  I discussed use of topical topical agents and also possible methotrexate.  Patient declined the treatment.  He states he will try over-the-counter medications for the psoriasis at this point.  Pain in both hands -he has a stiffness in his hands but no synovitis was noted.  Plan: XR Hand 2 View Right, XR Hand 2 View Left.  X-ray of bilateral hands are consistent with severe osteoarthritis.  Joint protection muscle strengthening was discussed.  Chronic pain of both hips -he has a stiffness in his hip joints.  He has good range of motion of his hip joints.  Plan: XR HIPS BILAT W OR W/O PELVIS 3-4 VIEWS.  X-ray showed mild  osteoarthritic changes.  I have given him a handout on hip exercises.  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  Plan: XR Elbow 2 Views Right.  The x-ray showed severe narrowing of the elbow joint.  Could be related to possible inflammatory arthritis or prior injury.  He had no synovitis on examination today.  Orders: Orders Placed This Encounter  Procedures  . XR Hand 2 View Right  . XR Hand 2 View Left  . XR Elbow 2 Views Right  . XR HIPS BILAT W OR W/O  PELVIS 3-4 VIEWS   No orders of the defined types were placed in this encounter.   Face-to-face time spent with patient was 45 minutes. Greater than 50% of time was spent in counseling and coordination of care.  Follow-Up Instructions: Return in about 1 year (around 04/11/2020).   Bo Merino, MD  Note - This record has been created using Editor, commissioning.  Chart creation errors have been sought, but may not always  have been located. Such creation errors do not reflect on  the standard of medical care.

## 2019-04-12 ENCOUNTER — Ambulatory Visit: Payer: Self-pay

## 2019-04-12 ENCOUNTER — Encounter: Payer: Self-pay | Admitting: Rheumatology

## 2019-04-12 ENCOUNTER — Other Ambulatory Visit: Payer: Self-pay

## 2019-04-12 ENCOUNTER — Ambulatory Visit: Payer: BC Managed Care – PPO | Admitting: Rheumatology

## 2019-04-12 VITALS — BP 123/75 | HR 62 | Resp 15 | Ht 68.0 in | Wt 174.6 lb

## 2019-04-12 DIAGNOSIS — M79641 Pain in right hand: Secondary | ICD-10-CM | POA: Diagnosis not present

## 2019-04-12 DIAGNOSIS — M79642 Pain in left hand: Secondary | ICD-10-CM | POA: Diagnosis not present

## 2019-04-12 DIAGNOSIS — M25551 Pain in right hip: Secondary | ICD-10-CM | POA: Diagnosis not present

## 2019-04-12 DIAGNOSIS — G8929 Other chronic pain: Secondary | ICD-10-CM

## 2019-04-12 DIAGNOSIS — M24521 Contracture, right elbow: Secondary | ICD-10-CM | POA: Diagnosis not present

## 2019-04-12 DIAGNOSIS — J984 Other disorders of lung: Secondary | ICD-10-CM | POA: Diagnosis not present

## 2019-04-12 DIAGNOSIS — D860 Sarcoidosis of lung: Secondary | ICD-10-CM

## 2019-04-12 DIAGNOSIS — M25552 Pain in left hip: Secondary | ICD-10-CM

## 2019-04-12 DIAGNOSIS — L409 Psoriasis, unspecified: Secondary | ICD-10-CM | POA: Diagnosis not present

## 2019-04-12 NOTE — Patient Instructions (Signed)
Hip Exercises Ask your health care provider which exercises are safe for you. Do exercises exactly as told by your health care provider and adjust them as directed. It is normal to feel mild stretching, pulling, tightness, or discomfort as you do these exercises. Stop right away if you feel sudden pain or your pain gets worse. Do not begin these exercises until told by your health care provider. Stretching and range-of-motion exercises These exercises warm up your muscles and joints and improve the movement and flexibility of your hip. These exercises also help to relieve pain, numbness, and tingling. You may be asked to limit your range of motion if you had a hip replacement. Talk to your health care provider about these restrictions. Hamstrings, supine  1. Lie on your back (supine position). 2. Loop a belt or towel over the ball of your left / right foot. The ball of your foot is on the walking surface, right under your toes. 3. Straighten your left / right knee and slowly pull on the belt or towel to raise your leg until you feel a gentle stretch behind your knee (hamstring). ? Do not let your knee bend while you do this. ? Keep your other leg flat on the floor. 4. Hold this position for __________ seconds. 5. Slowly return your leg to the starting position. Repeat __________ times. Complete this exercise __________ times a day. Hip rotation  1. Lie on your back on a firm surface. 2. With your left / right hand, gently pull your left / right knee toward the shoulder that is on the same side of the body. Stop when your knee is pointing toward the ceiling. 3. Hold your left / right ankle with your other hand. 4. Keeping your knee steady, gently pull your left / right ankle toward your other shoulder until you feel a stretch in your buttocks. ? Keep your hips and shoulders firmly planted while you do this stretch. 5. Hold this position for __________ seconds. Repeat __________ times. Complete  this exercise __________ times a day. Seated stretch This exercise is sometimes called hamstrings and adductors stretch. 1. Sit on the floor with your legs stretched wide. Keep your knees straight during this exercise. 2. Keeping your head and back in a straight line, bend at your waist to reach for your left foot (position A). You should feel a stretch in your right inner thigh (adductors). 3. Hold this position for __________ seconds. Then slowly return to the upright position. 4. Keeping your head and back in a straight line, bend at your waist to reach forward (position B). You should feel a stretch behind both of your thighs and knees (hamstrings). 5. Hold this position for __________ seconds. Then slowly return to the upright position. 6. Keeping your head and back in a straight line, bend at your waist to reach for your right foot (position C). You should feel a stretch in your left inner thigh (adductors). 7. Hold this position for __________ seconds. Then slowly return to the upright position. Repeat __________ times. Complete this exercise __________ times a day. Lunge This exercise stretches the muscles of the hip (hip flexors). 1. Place your left / right knee on the floor and bend your other knee so that is directly over your ankle. You should be half-kneeling. 2. Keep good posture with your head over your shoulders. 3. Tighten your buttocks to point your tailbone downward. This will prevent your back from arching too much. 4. You should feel a   gentle stretch in the front of your left / right thigh and hip. If you do not feel a stretch, slide your other foot forward slightly and then slowly lunge forward with your chest up until your knee once again lines up over your ankle. ? Make sure your tailbone continues to point downward. 5. Hold this position for __________ seconds. 6. Slowly return to the starting position. Repeat __________ times. Complete this exercise __________ times a  day. Strengthening exercises These exercises build strength and endurance in your hip. Endurance is the ability to use your muscles for a long time, even after they get tired. Bridge This exercise strengthens the muscles of your hip (hip extensors). 1. Lie on your back on a firm surface with your knees bent and your feet flat on the floor. 2. Tighten your buttocks muscles and lift your bottom off the floor until the trunk of your body and your hips are level with your thighs. ? Do not arch your back. ? You should feel the muscles working in your buttocks and the back of your thighs. If you do not feel these muscles, slide your feet 1-2 inches (2.5-5 cm) farther away from your buttocks. 3. Hold this position for __________ seconds. 4. Slowly lower your hips to the starting position. 5. Let your muscles relax completely between repetitions. Repeat __________ times. Complete this exercise __________ times a day. Straight leg raises, side-lying This exercise strengthens the muscles that move the hip joint away from the center of the body (hip abductors). 1. Lie on your side with your left / right leg in the top position. Lie so your head, shoulder, hip, and knee line up. You may bend your bottom knee slightly to help you balance. 2. Roll your hips slightly forward, so your hips are stacked directly over each other and your left / right knee is facing forward. 3. Leading with your heel, lift your top leg 4-6 inches (10-15 cm). You should feel the muscles in your top hip lifting. ? Do not let your foot drift forward. ? Do not let your knee roll toward the ceiling. 4. Hold this position for __________ seconds. 5. Slowly return to the starting position. 6. Let your muscles relax completely between repetitions. Repeat __________ times. Complete this exercise __________ times a day. Straight leg raises, side-lying This exercise strengthen the muscles that move the hip joint toward the center of the  body (hip adductors). 1. Lie on your side with your left / right leg in the bottom position. Lie so your head, shoulder, hip, and knee line up. You may place your upper foot in front to help you balance. 2. Roll your hips slightly forward, so your hips are stacked directly over each other and your left / right knee is facing forward. 3. Tense the muscles in your inner thigh and lift your bottom leg 4-6 inches (10-15 cm). 4. Hold this position for __________ seconds. 5. Slowly return to the starting position. 6. Let your muscles relax completely between repetitions. Repeat __________ times. Complete this exercise __________ times a day. Straight leg raises, supine This exercise strengthens the muscles in the front of your thigh (quadriceps). 1. Lie on your back (supine position) with your left / right leg extended and your other knee bent. 2. Tense the muscles in the front of your left / right thigh. You should see your kneecap slide up or see increased dimpling just above your knee. 3. Keep these muscles tight as you raise your   leg 4-6 inches (10-15 cm) off the floor. Do not let your knee bend. 4. Hold this position for __________ seconds. 5. Keep these muscles tense as you lower your leg. 6. Relax the muscles slowly and completely between repetitions. Repeat __________ times. Complete this exercise __________ times a day. Hip abductors, standing This exercise strengthens the muscles that move the leg and hip joint away from the center of the body (hip abductors). 1. Tie one end of a rubber exercise band or tubing to a secure surface, such as a chair, table, or pole. 2. Loop the other end of the band or tubing around your left / right ankle. 3. Keeping your ankle with the band or tubing directly opposite the secured end, step away until there is tension in the tubing or band. Hold on to a chair, table, or pole as needed for balance. 4. Lift your left / right leg out to your side. While you do  this: ? Keep your back upright. ? Keep your shoulders over your hips. ? Keep your toes pointing forward. ? Make sure to use your hip muscles to slowly lift your leg. Do not tip your body or forcefully lift your leg. 5. Hold this position for __________ seconds. 6. Slowly return to the starting position. Repeat __________ times. Complete this exercise __________ times a day. Squats This exercise strengthens the muscles in the front of your thigh (quadriceps). 1. Stand in a door frame so your feet and knees are in line with the frame. You may place your hands on the frame for balance. 2. Slowly bend your knees and lower your hips like you are going to sit in a chair. ? Keep your lower legs in a straight-up-and-down position. ? Do not let your hips go lower than your knees. ? Do not bend your knees lower than told by your health care provider. ? If your hip pain increases, do not bend as low. 3. Hold this position for ___________ seconds. 4. Slowly push with your legs to return to standing. Do not use your hands to pull yourself to standing. Repeat __________ times. Complete this exercise __________ times a day. This information is not intended to replace advice given to you by your health care provider. Make sure you discuss any questions you have with your health care provider. Document Released: 07/04/2005 Document Revised: 04/27/2018 Document Reviewed: 04/27/2018 Elsevier Patient Education  2020 [employer]  

## 2019-04-27 ENCOUNTER — Ambulatory Visit: Payer: BC Managed Care – PPO | Admitting: Rheumatology

## 2019-05-03 ENCOUNTER — Encounter: Payer: Self-pay | Admitting: Internal Medicine

## 2019-05-05 ENCOUNTER — Telehealth: Payer: Self-pay | Admitting: Family Medicine

## 2019-05-05 NOTE — Telephone Encounter (Signed)
Please advise if referral cn be ordered or does patient need to have office visit.  Thanks

## 2019-05-05 NOTE — Telephone Encounter (Signed)
Left message to please call our office.    Dr. Hilarie Fredrickson sent letter to patient  with appointment Nov. 24 @3 :30.

## 2019-05-05 NOTE — Telephone Encounter (Signed)
HE already has an appointment for 11/24 with GI.

## 2019-06-07 ENCOUNTER — Encounter: Payer: BC Managed Care – PPO | Admitting: Internal Medicine

## 2020-01-31 ENCOUNTER — Encounter: Payer: Self-pay | Admitting: Internal Medicine

## 2020-02-15 DIAGNOSIS — M25562 Pain in left knee: Secondary | ICD-10-CM | POA: Diagnosis not present

## 2020-02-23 ENCOUNTER — Encounter: Payer: Self-pay | Admitting: Pulmonary Disease

## 2020-02-23 ENCOUNTER — Other Ambulatory Visit: Payer: Self-pay

## 2020-02-23 ENCOUNTER — Ambulatory Visit (INDEPENDENT_AMBULATORY_CARE_PROVIDER_SITE_OTHER): Payer: BC Managed Care – PPO

## 2020-02-23 ENCOUNTER — Ambulatory Visit: Payer: BC Managed Care – PPO | Admitting: Pulmonary Disease

## 2020-02-23 VITALS — BP 118/62 | HR 55 | Temp 98.3°F | Ht 67.0 in | Wt 172.0 lb

## 2020-02-23 DIAGNOSIS — D860 Sarcoidosis of lung: Secondary | ICD-10-CM | POA: Diagnosis not present

## 2020-02-23 DIAGNOSIS — D869 Sarcoidosis, unspecified: Secondary | ICD-10-CM | POA: Diagnosis not present

## 2020-02-23 NOTE — Assessment & Plan Note (Addendum)
Appears stable by history. Chest x-ray today >> residual left upper lobe scarring Obtain PFTs next year

## 2020-02-23 NOTE — Patient Instructions (Signed)
CXR today PFTs next visit in 1 year

## 2020-02-23 NOTE — Progress Notes (Signed)
   Subjective:    Patient ID: Epimenio Foot, male    DOB: March 26, 1951, 69 y.o.   MRN: [id number]  HPI  69 yo never smoker with sarcoidosis  for annual follow-up. He was diagnosed in 2015 when he presented with masslike consolidation in the left upper lobe and hilar lymphadenopathy and underwent bronchoscopic biopsy.  Chest x-ray 01/2019 showed normal left upper lobe scarring. He has an uneventful year, no coughing wheezing or bronchitis. 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 Reduce size of left upper lobe consolidation/mass. Diffuse peri-bronchovascular nodularity. Right hilar lymph node measuring approximately 2 cm. No other nodule or mass appreciated. No pericardial effusion. Patulous esophagus.   PATHOLOGY BRUSH LUL (09/14/13): Reactive bronchial epithelial cells with fragments of granuloma. No atypia or malignancy. BAL LUL (09/14/13): Rare atypical cells. Abundant histiocytes. Acute and chronic inflammation. FNA & TBBx LUL (09/14/13): Chronic nonnecrotizing granulomatous inflammation with granulomata. AFB, GMS, & PAS stains negative. No atypia or malignancy  Review of Systems Patient denies significant dyspnea,cough, hemoptysis,  chest pain, palpitations, pedal edema, orthopnea, paroxysmal nocturnal dyspnea, lightheadedness, nausea, vomiting, abdominal or  leg pains      Objective:   Physical Exam  Gen. Pleasant, well-nourished, in no distress ENT - no  thrush, no pallor/icterus,no post nasal drip Neck: No JVD, no thyromegaly, no carotid bruits Lungs: no use of accessory muscles, no dullness to percussion, clear without rales or rhonchi  Cardiovascular: Rhythm regular, heart sounds  normal, no murmurs or gallops, no peripheral edema Musculoskeletal: No deformities, no cyanosis or clubbing        Assessment & Plan:

## 2020-03-19 DIAGNOSIS — N401 Enlarged prostate with lower urinary tract symptoms: Secondary | ICD-10-CM | POA: Diagnosis not present

## 2020-03-19 DIAGNOSIS — R350 Frequency of micturition: Secondary | ICD-10-CM | POA: Diagnosis not present

## 2020-03-26 DIAGNOSIS — R3912 Poor urinary stream: Secondary | ICD-10-CM | POA: Diagnosis not present

## 2020-03-26 DIAGNOSIS — N401 Enlarged prostate with lower urinary tract symptoms: Secondary | ICD-10-CM | POA: Diagnosis not present

## 2020-03-26 DIAGNOSIS — R972 Elevated prostate specific antigen [PSA]: Secondary | ICD-10-CM | POA: Diagnosis not present

## 2020-04-04 ENCOUNTER — Other Ambulatory Visit: Payer: Self-pay

## 2020-04-04 ENCOUNTER — Ambulatory Visit (AMBULATORY_SURGERY_CENTER): Payer: Self-pay | Admitting: *Deleted

## 2020-04-04 VITALS — Ht 67.0 in | Wt 171.0 lb

## 2020-04-04 DIAGNOSIS — Z86010 Personal history of colonic polyps: Secondary | ICD-10-CM

## 2020-04-04 MED ORDER — PLENVU 140 G PO SOLR: 1.0000 | ORAL | 0 refills | Status: DC

## 2020-04-04 NOTE — Progress Notes (Signed)
cov vax x 2  No egg or soy allergy known to patient  No issues with past sedation with any surgeries or procedures no intubation problems in the past  No FH of Malignant Hyperthermia No diet pills per patient No home 02 use per patient  No blood thinners per patient  Pt denies issues with constipation  No A fib or A flutter  EMMI video to pt or via MyChart  COVID 19 guidelines implemented in PV today with Pt and RN   Plenvu  Coupon given to pt in PV today , Code to Pharmacy   Due to the COVID-19 pandemic we are asking patients to follow these guidelines. Please only bring one care partner. Please be aware that your care partner may wait in the car in the parking lot or if they feel like they will be too hot to wait in the car, they may wait in the lobby on the 4th floor. All care partners are required to wear a mask the entire time (we do not have any that we can provide them), they need to practice social distancing, and we will do a Covid check for all patient's and care partners when you arrive. Also we will check their temperature and your temperature. If the care partner waits in their car they need to stay in the parking lot the entire time and we will call them on their cell phone when the patient is ready for discharge so they can bring the car to the front of the building. Also all patient's will need to wear a mask into building.  

## 2020-04-05 ENCOUNTER — Encounter: Payer: Self-pay | Admitting: Internal Medicine

## 2020-04-18 ENCOUNTER — Encounter: Payer: Self-pay | Admitting: Internal Medicine

## 2020-04-18 ENCOUNTER — Other Ambulatory Visit: Payer: Self-pay

## 2020-04-18 ENCOUNTER — Ambulatory Visit (AMBULATORY_SURGERY_CENTER): Payer: BC Managed Care – PPO | Admitting: Internal Medicine

## 2020-04-18 VITALS — BP 124/75 | HR 100 | Temp 96.8°F | Resp 16 | Ht 67.0 in | Wt 171.0 lb

## 2020-04-18 DIAGNOSIS — Z86010 Personal history of colonic polyps: Secondary | ICD-10-CM | POA: Diagnosis not present

## 2020-04-18 DIAGNOSIS — D122 Benign neoplasm of ascending colon: Secondary | ICD-10-CM

## 2020-04-18 DIAGNOSIS — Z1211 Encounter for screening for malignant neoplasm of colon: Secondary | ICD-10-CM | POA: Diagnosis not present

## 2020-04-18 DIAGNOSIS — D120 Benign neoplasm of cecum: Secondary | ICD-10-CM | POA: Diagnosis not present

## 2020-04-18 MED ORDER — SODIUM CHLORIDE 0.9 % IV SOLN: 500.0000 mL | Freq: Once | INTRAVENOUS | Status: DC

## 2020-04-18 NOTE — Patient Instructions (Signed)
YOU HAD AN ENDOSCOPIC PROCEDURE TODAY AT [facility]:   Refer to the procedure report that was given to you for any specific questions about what was found during the examination.  If the procedure report does not answer your questions, please call your gastroenterologist to clarify.  If you requested that your care partner not be given the details of your procedure findings, then the procedure report has been included in a sealed envelope for you to review at your convenience later.  YOU SHOULD EXPECT: Some feelings of bloating in the abdomen. Passage of more gas than usual.  Walking can help get rid of the air that was put into your GI tract during the procedure and reduce the bloating. If you had a lower endoscopy (such as a colonoscopy or flexible sigmoidoscopy) you may notice spotting of blood in your stool or on the toilet paper. If you underwent a bowel prep for your procedure, you may not have a normal bowel movement for a few days.  Please Note:  You might notice some irritation and congestion in your nose or some drainage.  This is from the oxygen used during your procedure.  There is no need for concern and it should clear up in a day or so.  SYMPTOMS TO REPORT IMMEDIATELY:   Following lower endoscopy (colonoscopy or flexible sigmoidoscopy):  Excessive amounts of blood in the stool  Significant tenderness or worsening of abdominal pains  Swelling of the abdomen that is new, acute  Fever of 100F or higher   For urgent or emergent issues, a gastroenterologist can be reached at any hour by calling [phone]. Do not use MyChart messaging for urgent concerns.    DIET:  We do recommend a small meal at first, but then you may proceed to your regular diet.  Drink plenty of fluids but you should avoid alcoholic beverages for 24 hours.  MEDICATIONS: Continue current medications.  Please see handouts given to you by your recovery nurse.  ACTIVITY:  You should plan to  take it easy for the rest of today and you should NOT DRIVE or use heavy machinery until tomorrow (because of the sedation medicines used during the test).    FOLLOW UP: Our staff will call the number listed on your records 48-72 hours following your procedure to check on you and address any questions or concerns that you may have regarding the information given to you following your procedure. If we do not reach you, we will leave a message.  We will attempt to reach you two times.  During this call, we will ask if you have developed any symptoms of COVID 19. If you develop any symptoms (ie: fever, flu-like symptoms, shortness of breath, cough etc.) before then, please call [phone].  If you test positive for Covid 19 in the 2 weeks post procedure, please call and report this information to [place].    If any biopsies were taken you will be contacted by phone or by letter within the next 1-3 weeks.  Please call us at [phone] if you have not heard about the biopsies in 3 weeks.   Thank you for allowing [place] to provide for your healthcare needs today.   SIGNATURES/CONFIDENTIALITY: You and/or your care partner have signed paperwork which will be entered into your electronic medical record.  These signatures attest to the fact that that the information above on your After Visit Summary has been reviewed and is understood.  Full responsibility of the  confidentiality of this discharge information lies with you and/or your care-partner. 

## 2020-04-18 NOTE — Progress Notes (Signed)
Called to room to assist during endoscopic procedure.  Patient ID and intended procedure confirmed with present staff. Received instructions for my participation in the procedure from the performing physician.  

## 2020-04-18 NOTE — Progress Notes (Signed)
A/ox3, pleased with MAC, report to RN 

## 2020-04-18 NOTE — Progress Notes (Signed)
Vitals-SH  Pt's states no medical or surgical changes since previsit or office visit. 

## 2020-04-18 NOTE — Op Note (Signed)
[facility] Patient Name: William [name] Procedure Date: 04/18/2020 1:33 PM MRN: [id number] Endoscopist: Jerene Bears , MD Age: 69 Referring MD:  Date of Birth: 15-Nov-1950 Gender: Male Account #: [id number] Procedure:                Colonoscopy Indications:              High risk colon cancer surveillance: Personal                            history of adenoma (10 mm or greater in size), Last                            colonoscopy: 2011 Medicines:                Monitored Anesthesia Care Procedure:                Pre-Anesthesia Assessment:                           - Prior to the procedure, a History and Physical                            was performed, and patient medications and                            allergies were reviewed. The patient's tolerance of                            previous anesthesia was also reviewed. The risks                            and benefits of the procedure and the sedation                            options and risks were discussed with the patient.                            All questions were answered, and informed consent                            was obtained. Prior Anticoagulants: The patient has                            taken no previous anticoagulant or antiplatelet                            agents. ASA Grade Assessment: II - A patient with                            mild systemic disease. After reviewing the risks                            and benefits, the patient was deemed in  satisfactory condition to undergo the procedure.                           After obtaining informed consent, the colonoscope                            was passed under direct vision. Throughout the                            procedure, the patient's blood pressure, pulse, and                            oxygen saturations were monitored continuously. The                            Colonoscope was introduced through the anus and                             advanced to the cecum, identified by appendiceal                            orifice and ileocecal valve. The colonoscopy was                            performed without difficulty. The patient tolerated                            the procedure well. The quality of the bowel                            preparation was good. The ileocecal valve,                            appendiceal orifice, and rectum were photographed. Scope In: 1:40:13 PM Scope Out: 2:03:44 PM Scope Withdrawal Time: 0 hours 19 minutes 49 seconds  Total Procedure Duration: 0 hours 23 minutes 31 seconds  Findings:                 The digital rectal exam was normal.                           A 5 mm polyp was found in the cecum. The polyp was                            sessile. The polyp was removed with a cold snare.                            Resection and retrieval were complete.                           Four sessile polyps were found in the ascending                            colon. The polyps were 3 to  6 mm in size. These                            polyps were removed with a cold snare. Resection                            and retrieval were complete.                           The exam was otherwise without abnormality on                            direct and retroflexion views. Complications:            No immediate complications. Estimated Blood Loss:     Estimated blood loss was minimal. Impression:               - One 5 mm polyp in the cecum, removed with a cold                            snare. Resected and retrieved.                           - Four 3 to 6 mm polyps in the ascending colon,                            removed with a cold snare. Resected and retrieved.                           - The examination was otherwise normal on direct                            and retroflexion views. Recommendation:           - Patient has a contact number available for                             emergencies. The signs and symptoms of potential                            delayed complications were discussed with the                            patient. Return to normal activities tomorrow.                            Written discharge instructions were provided to the                            patient.                           - Resume previous diet.                           - Continue present medications.                           -  Await pathology results.                           - Repeat colonoscopy is recommended for                            surveillance. The colonoscopy date will be                            determined after pathology results from today's                            exam become available for review. Jerene Bears, MD 04/18/2020 2:10:13 PM This report has been signed electronically.

## 2020-04-20 ENCOUNTER — Telehealth: Payer: Self-pay | Admitting: *Deleted

## 2020-04-20 NOTE — Telephone Encounter (Signed)
  Follow up Call-  Call back number 04/18/2020  Post procedure Call Back phone  # [phone]  Permission to leave phone message Yes  Some recent data might be hidden     Patient questions:  Do you have a fever, pain , or abdominal swelling? No. Pain Score  0 *  Have you tolerated food without any problems? Yes.    Have you been able to return to your normal activities? Yes.    Do you have any questions about your discharge instructions: Diet   No. Medications  No. Follow up visit  No.  Do you have questions or concerns about your Care? No.  Actions: * If pain score is 4 or above: No action needed, pain <4  1. Have you developed a fever since your procedure? NO  2.   Have you had an respiratory symptoms (SOB or cough) since your procedure? NO  3.   Have you tested positive for COVID 19 since your procedure NO  4.   Have you had any family members/close contacts diagnosed with the COVID 19 since your procedure?  NO   If yes to any of these questions please route to Joylene John, RN and Joella Prince, RN

## 2020-04-20 NOTE — Telephone Encounter (Signed)
Phone call. No answer. Number identifier. Message left to call if any questions or concerns and we would attempt to call again later in the day.

## 2020-04-24 ENCOUNTER — Encounter: Payer: Self-pay | Admitting: Internal Medicine

## 2020-06-13 DIAGNOSIS — R3912 Poor urinary stream: Secondary | ICD-10-CM | POA: Diagnosis not present

## 2020-06-13 DIAGNOSIS — N401 Enlarged prostate with lower urinary tract symptoms: Secondary | ICD-10-CM | POA: Diagnosis not present

## 2020-06-20 DIAGNOSIS — R3912 Poor urinary stream: Secondary | ICD-10-CM | POA: Diagnosis not present

## 2020-06-20 DIAGNOSIS — N401 Enlarged prostate with lower urinary tract symptoms: Secondary | ICD-10-CM | POA: Diagnosis not present

## 2020-06-20 DIAGNOSIS — R972 Elevated prostate specific antigen [PSA]: Secondary | ICD-10-CM | POA: Diagnosis not present

## 2020-12-04 ENCOUNTER — Ambulatory Visit: Payer: BLUE CROSS/BLUE SHIELD | Admitting: Family Medicine

## 2020-12-04 ENCOUNTER — Other Ambulatory Visit: Payer: Self-pay

## 2020-12-04 ENCOUNTER — Encounter: Payer: Self-pay | Admitting: Family Medicine

## 2020-12-04 VITALS — BP 122/73 | HR 59 | Temp 96.8°F | Ht 67.0 in | Wt 171.0 lb

## 2020-12-04 DIAGNOSIS — S71152A Open bite, left thigh, initial encounter: Secondary | ICD-10-CM | POA: Diagnosis not present

## 2020-12-04 DIAGNOSIS — W540XXA Bitten by dog, initial encounter: Secondary | ICD-10-CM

## 2020-12-04 MED ORDER — AMOXICILLIN-POT CLAVULANATE 875-125 MG PO TABS: 1.0000 | ORAL_TABLET | Freq: Two times a day (BID) | ORAL | 0 refills | Status: AC

## 2020-12-04 NOTE — Progress Notes (Signed)
Acute Office Visit  Subjective:    Patient ID: William [name], male    DOB: 1950/11/03, 70 y.o.   MRN: [id number]  Chief Complaint  Patient presents with  . Animal Bite    HPI Patient is in today for a dog bite that occurred last night around 7 pm. This occurred from his neighbors dog. He was speaking with his neighbor as as he was walking back to his property, the dog lunged at him and bit him. It was a small terrier type dog. The neighbor said his dog was UTD on vaccines. He reports that the neighbors dog often comes into his yard after him. He reports the bite in on the back of his right thigh. He doesn't think that it broke the skin. The area is a little tender. Denies fever, chills, or drainage. He is not UTD on his tetanus. He would like animal control contacted.   Past Medical History:  Diagnosis Date  . Allergy    occ   . GERD (gastroesophageal reflux disease)    past hx   . Hyperlipidemia   . Sarcoidosis 08/2013   Diagnosed with bronchoscopy. Pulmonary Involvement only.     Past Surgical History:  Procedure Laterality Date  . APPENDECTOMY    . CHOLECYSTECTOMY    . COLONOSCOPY    . LAPAROSCOPIC APPENDECTOMY N/A 04/06/2018   Procedure: APPENDECTOMY LAPAROSCOPIC;  Surgeon: Aviva Signs, MD;  Location: [facility];  Service: General;  Laterality: N/A;  . POLYPECTOMY    . UPPER GASTROINTESTINAL ENDOSCOPY    . VIDEO BRONCHOSCOPY Bilateral 09/14/2013   Procedure: VIDEO BRONCHOSCOPY WITH FLUORO;  Surgeon: Kathee Delton, MD;  Location: [facility];  Service: Cardiopulmonary;  Laterality: Bilateral;    Family History  Problem Relation Age of Onset  . Allergies Father   . Heart disease Father   . Lung disease Neg Hx   . Rheumatologic disease Neg Hx   . Colon cancer Neg Hx   . Colon polyps Neg Hx   . Esophageal cancer Neg Hx   . Stomach cancer Neg Hx   . Rectal cancer Neg Hx     Social History   Socioeconomic History  . Marital status: Married    Spouse name: Not on  file  . Number of children: Not on file  . Years of education: Not on file  . Highest education level: Not on file  Occupational History  . Occupation: English as a second language teacher: [employer]  Tobacco Use  . Smoking status: Never Smoker  . Smokeless tobacco: Never Used  Vaping Use  . Vaping Use: Never used  Substance and Sexual Activity  . Alcohol use: No  . Drug use: No  . Sexual activity: Not on file  Other Topics Concern  . Not on file  Social History Narrative   Originally from [place]. Always lived in [place]. Previously traveled to [place]. No international travel. No pets currently. No bird, mold, or hot tub exposure. 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    Social Determinants of Health   Financial Resource Strain: Not on file  Food Insecurity: Not on file  Transportation Needs: Not on file  Physical Activity: Not on file  Stress: Not on file  Social Connections: Not on file  Intimate Partner Violence: Not on file    Outpatient Medications Prior to Visit  Medication Sig Dispense Refill  . acetaminophen (TYLENOL) 325  MG tablet Take 325 mg by mouth as needed for moderate pain.    . B Complex Vitamins (B COMPLEX 1 PO) Take by mouth.    . cholecalciferol (VITAMIN D) 400 units TABS tablet Take 400 Units by mouth daily.    . Coenzyme Q10 (CO Q 10 PO) Take 100 mg by mouth daily.    . fluticasone (FLONASE) 50 MCG/ACT nasal spray Place 2 sprays into both nostrils daily as needed for allergies or rhinitis.     . meloxicam (MOBIC) 7.5 MG tablet Take 7.5 mg by mouth daily.    . Multiple Vitamin (MULTIVITAMIN) tablet Take 1 tablet by mouth daily.    . Omega-3 Fatty Acids (FISH OIL) 1000 MG CAPS Take by mouth.    . Probiotic Product (PROBIOTIC DAILY PO) Take by mouth.    . Saw Palmetto, Serenoa repens, (SAW PALMETTO PO) Take by mouth. Prostate plus     Facility-Administered Medications Prior to Visit   Medication Dose Route Frequency Provider Last Rate Last Admin  . 0.9 %  sodium chloride infusion  500 mL Intravenous Once Pyrtle, Lajuan Lines, MD        No Known Allergies  Review of Systems As per HPI.     Objective:    Physical Exam Vitals and nursing note reviewed.  Constitutional:      General: He is not in acute distress.    Appearance: Normal appearance. He is not ill-appearing, toxic-appearing or diaphoretic.  HENT:     Head: Normocephalic and atraumatic.  Pulmonary:     Effort: Pulmonary effort is normal. No respiratory distress.  Skin:    General: Skin is warm and dry.       Neurological:     General: No focal deficit present.     Mental Status: He is alert and oriented to person, place, and time.  Psychiatric:        Mood and Affect: Mood normal.        Behavior: Behavior normal.     BP 122/73   Pulse (!) 59   Temp (!) 96.8 F (36 C) (Temporal)   Ht 5\' 7"  (1.702 m)   Wt 171 lb (77.6 kg)   BMI 26.78 kg/m  Wt Readings from Last 3 Encounters:  12/04/20 171 lb (77.6 kg)  04/18/20 171 lb (77.6 kg)  04/04/20 171 lb (77.6 kg)    Health Maintenance Due  Topic Date Due  . COVID-19 Vaccine (1) Never done  . Hepatitis C Screening  Never done  . TETANUS/TDAP  Never done  . Zoster Vaccines- Shingrix (1 of 2) Never done  . Pneumococcal Vaccine 41-16 Years old (1 of 2 - PPSV23) Never done  . PNA vac Low Risk Adult (2 of 2 - PPSV23) 09/06/2020    There are no preventive care reminders to display for this patient.   No results found for: TSH Lab Results  Component Value Date   WBC 10.4 04/07/2018   HGB 13.5 04/07/2018   HCT 41.1 04/07/2018   MCV 91.7 04/07/2018   PLT 170 04/07/2018   Lab Results  Component Value Date   NA 135 04/07/2018   K 3.9 04/07/2018   CO2 25 04/07/2018   GLUCOSE 120 (H) 04/07/2018   BUN 17 04/07/2018   CREATININE 1.04 04/07/2018   BILITOT 1.9 (H) 04/06/2018   ALKPHOS 75 04/06/2018   AST 23 04/06/2018   ALT 19 04/06/2018    PROT 8.4 (H) 04/06/2018   ALBUMIN 4.5 04/06/2018  CALCIUM 8.0 (L) 04/07/2018   ANIONGAP 7 04/07/2018   No results found for: CHOL No results found for: HDL No results found for: LDLCALC No results found for: TRIG No results found for: CHOLHDL No results found for: HGBA1C     Assessment & Plan:   William [name] was seen today for animal bite.  Diagnoses and all orders for this visit:  Dog bite of left thigh, initial encounter No puncture wound. Augmentin as below for prophylaxis. Sheriffs department notified and spoke with patient today in office. It was verified that the dog was UTD on vaccine and had a 3 year rabies vaccines in February of 2022. Patient will have tetanus shot completed at the pharmacy. Return to office for new or worsening symptoms, or if symptoms persist.  -     amoxicillin-clavulanate (AUGMENTIN) 875-125 MG tablet; Take 1 tablet by mouth 2 (two) times daily for 7 days.  The patient indicates understanding of these issues and agrees with the plan.   Gwenlyn Perking, FNP

## 2020-12-04 NOTE — Patient Instructions (Signed)
Animal Bite, Adult Animal bites range from mild to serious. An animal bite can result in any of these injuries:  A scratch.  A deep, open cut.  A puncture of the skin.  A crush injury.  Tearing away of the skin or a body part.  A bone injury. A small bite from a house pet is usually less serious than a bite from a stray or wild animal, such as a raccoon, fox, skunk, or bat. That is because stray and wild animals have a higher risk of carrying a serious infection called rabies, which can be passed to humans through a bite. What increases the risk? You are more likely to be bitten by an animal if:  You are around unfamiliar pets.  You disturb an animal when it is eating, sleeping, or caring for its babies.  You are outdoors in a place where small, wild animals roam freely. What are the signs or symptoms? Common symptoms of an animal bite include:  Pain.  Bleeding.  Swelling.  Bruising. How is this diagnosed? This condition may be diagnosed based on a physical exam and medical history. Your health care provider will examine your wound and ask for details about the animal and how the bite happened. You may also have tests, such as:  Blood tests to check for infection.  X-rays to check for damage to bones or joints.  Taking a fluid sample from your wound and checking it for infection (culture test). How is this treated? Treatment varies depending on the type of animal, where the bite is on your body, and your medical history. Treatment may include:  Caring for the wound. This often includes cleaning the wound, rinsing out (flushing) the wound with saline solution, and applying a bandage (dressing). In some cases, the wound may be closed with stitches (sutures), staples, skin glue, or adhesive strips.  Antibiotic medicine to prevent or treat infection. This medicine may be prescribed in pill or ointment form. If the bite area becomes infected, the medicine may be given through  an IV.  A tetanus shot to prevent tetanus infection.  Rabies treatment to prevent rabies infection. This will be done if the animal could have rabies.  Surgery. This may be done if a bite gets infected or if there is damage that needs to be repaired. Follow these instructions at home: Wound care  Follow instructions from your health care provider about how to take care of your wound. Make sure you: ? Wash your hands with soap and water before you change your dressing. If soap and water are not available, use hand sanitizer. ? Change your dressing as told by your health care provider. ? Leave sutures, skin glue, or adhesive strips in place. These skin closures may need to stay in place for 2 weeks or longer. If adhesive strip edges start to loosen and curl up, you may trim the loose edges. Do not remove adhesive strips completely unless your health care provider tells you to do that.  Check your wound every day for signs of infection. Check for: ? More redness, swelling, or pain. ? More fluid or blood. ? Warmth. ? Pus or a bad smell.   Medicines  Take or apply over-the-counter and prescription medicines only as told by your health care provider.  If you were prescribed an antibiotic, take or apply it as told by your health care provider. Do not stop using the antibiotic even if your condition improves. General instructions  Keep the injured  area raised (elevated) above the level of your heart while you are sitting or lying down, if this is possible.  If directed, put ice on the injured area. ? Put ice in a plastic bag. ? Place a towel between your skin and the bag. ? Leave the ice on for 20 minutes, 2-3 times per day.  Keep all follow-up visits as told by your health care provider. This is important.   Contact a health care provider if:  You have more redness, swelling, or pain around your wound.  Your wound feels warm to the touch.  You have a fever or chills.  You have a  general feeling of sickness (malaise).  You feel nauseous or you vomit.  You have pain that does not get better. Get help right away if:  You have a red streak that leads away from your wound.  You have non-clear fluid or more blood coming from your wound.  There is pus or a bad smell coming from your wound.  You have trouble moving your injured area.  You have numbness or tingling that extends beyond the wound. Summary  Animal bites can range from mild to serious. An animal bite can cause a scratch on the skin, a deep open cut, a puncture of the skin, a crush injury, tearing away of the skin or a body part, or a bone injury.  Your health care provider will examine your wound and ask for details about the animal and how the bite happened.  You may also have tests such as a blood test, X-ray, or testing of a fluid sample from your wound (culture test).  Treatment may include wound care, antibiotic medicine, a tetanus shot, and rabies treatment if the animal could have rabies. This information is not intended to replace advice given to you by your health care provider. Make sure you discuss any questions you have with your health care provider. Document Revised: 04/10/2020 Document Reviewed: 04/10/2020 Elsevier Patient Education  2021 [employer].

## 2021-02-02 ENCOUNTER — Encounter: Payer: Self-pay | Admitting: Emergency Medicine

## 2021-02-02 ENCOUNTER — Other Ambulatory Visit: Payer: Self-pay

## 2021-02-02 ENCOUNTER — Ambulatory Visit
Admission: EM | Admit: 2021-02-02 | Discharge: 2021-02-02 | Disposition: A | Discharge status: Home / Self Care | Payer: PPO | Attending: Physician Assistant | Admitting: Physician Assistant

## 2021-02-02 DIAGNOSIS — N390 Urinary tract infection, site not specified: Secondary | ICD-10-CM | POA: Diagnosis not present

## 2021-02-02 DIAGNOSIS — R319 Hematuria, unspecified: Secondary | ICD-10-CM | POA: Diagnosis not present

## 2021-02-02 LAB — POCT URINALYSIS DIP (MANUAL ENTRY)
Glucose, UA: NEGATIVE mg/dL
Nitrite, UA: POSITIVE — AB
Protein Ur, POC: >=300 mg/dL — AB
Spec Grav, UA: 1.015 (ref 1.010–1.025)
Urobilinogen, UA: 1 E.U./dL
pH, UA: 5 (ref 5.0–8.0)

## 2021-02-02 MED ORDER — SULFAMETHOXAZOLE-TRIMETHOPRIM 800-160 MG PO TABS: 1.0000 | ORAL_TABLET | Freq: Two times a day (BID) | ORAL | 0 refills | Status: DC

## 2021-02-02 NOTE — ED Triage Notes (Signed)
States he noticed blood in his urine on Thursday, denies any pain.

## 2021-02-02 NOTE — ED Provider Notes (Signed)
[facility]    CSN: [id number] Arrival date & time: 02/02/21  0809      History   Chief Complaint No chief complaint on file.   HPI William [name] is a 70 y.o. male.   Pt complains hematuria that started three days ago.  Denies dysuria, flank pain, abdominal pain, fever chills.  He has never experienced similar sx.  He has tried nothing for the sx.  He follows with PCP.    Past Medical History:  Diagnosis Date   Allergy    occ    GERD (gastroesophageal reflux disease)    past hx    Hyperlipidemia    Sarcoidosis 08/2013   Diagnosed with bronchoscopy. Pulmonary Involvement only.     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    Past Surgical History:  Procedure Laterality Date   APPENDECTOMY     CHOLECYSTECTOMY     COLONOSCOPY     LAPAROSCOPIC APPENDECTOMY N/A 04/06/2018   Procedure: APPENDECTOMY LAPAROSCOPIC;  Surgeon: Aviva Signs, MD;  Location: [facility];  Service: General;  Laterality: N/A;   POLYPECTOMY     UPPER GASTROINTESTINAL ENDOSCOPY     VIDEO BRONCHOSCOPY Bilateral 09/14/2013   Procedure: VIDEO BRONCHOSCOPY WITH FLUORO;  Surgeon: Kathee Delton, MD;  Location: [facility];  Service: Cardiopulmonary;  Laterality: Bilateral;       Home Medications    Prior to Admission medications   Medication Sig Start Date End Date Taking? Authorizing Provider  sulfamethoxazole-trimethoprim (BACTRIM DS) 800-160 MG tablet Take 1 tablet by mouth 2 (two) times daily for 7 days. 02/02/21 02/09/21 Yes Lee Kalt, Janett Billow, PA-C  acetaminophen (TYLENOL) 325 MG tablet Take 325 mg by mouth as needed for moderate pain.    [provider]  B Complex Vitamins (B COMPLEX 1 PO) Take by mouth.    [provider]   cholecalciferol (VITAMIN D) 400 units TABS tablet Take 400 Units by mouth daily.    [provider]  Coenzyme Q10 (CO Q 10 PO) Take 100 mg by mouth daily.    [provider]  fluticasone (FLONASE) 50 MCG/ACT nasal spray Place 2 sprays into both nostrils daily as needed for allergies or rhinitis.  08/01/13   [provider]  meloxicam (MOBIC) 7.5 MG tablet Take 7.5 mg by mouth daily. 02/15/20   [provider]  Multiple Vitamin (MULTIVITAMIN) tablet Take 1 tablet by mouth daily.    [provider]  Omega-3 Fatty Acids (FISH OIL) 1000 MG CAPS Take by mouth.    [provider]  Probiotic Product (PROBIOTIC DAILY PO) Take by mouth.    [provider]  Saw Palmetto, Serenoa repens, (SAW PALMETTO PO) Take by mouth. Prostate plus    [provider]    Family History Family History  Problem Relation Age of Onset   Allergies Father    Heart disease Father    Lung disease Neg Hx    Rheumatologic disease Neg Hx    Colon cancer Neg Hx    Colon polyps Neg Hx    Esophageal cancer Neg Hx    Stomach cancer Neg Hx    Rectal cancer Neg Hx     Social History Social History   Tobacco Use   Smoking status: Never  Smokeless tobacco: Never  Vaping Use   Vaping Use: Never used  Substance Use Topics   Alcohol use: No   Drug use: No     Allergies   Patient has no known allergies.   Review of Systems Review of Systems  Constitutional:  Negative for chills and fever.  HENT:  Negative for ear pain and sore throat.   Eyes:  Negative for pain and visual disturbance.  Respiratory:  Negative for cough and shortness of breath.   Cardiovascular:  Negative for chest pain and palpitations.  Gastrointestinal:  Negative for abdominal pain and vomiting.  Genitourinary:  Positive for hematuria. Negative for dysuria, flank pain, frequency, penile discharge, testicular pain and urgency.  Musculoskeletal:  Negative for arthralgias and  back pain.  Skin:  Negative for color change and rash.  Neurological:  Negative for seizures and syncope.  All other systems reviewed and are negative.   Physical Exam Triage Vital Signs ED Triage Vitals  Enc Vitals Group     BP 02/02/21 0819 136/72     Pulse Rate 02/02/21 0819 78     Resp 02/02/21 0819 16     Temp 02/02/21 0819 98 F (36.7 C)     Temp Source 02/02/21 0819 Tympanic     SpO2 02/02/21 0819 99 %     Weight --      Height --      Head Circumference --      Peak Flow --      Pain Score 02/02/21 0820 0     Pain Loc --      Pain Edu? --      Excl. in [place]? --    No data found.  Updated Vital Signs BP 136/72 (BP Location: Right Arm)   Pulse 78   Temp 98 F (36.7 C) (Tympanic)   Resp 16   SpO2 99%   Visual Acuity Right Eye Distance:   Left Eye Distance:   Bilateral Distance:    Right Eye Near:   Left Eye Near:    Bilateral Near:     Physical Exam Vitals and nursing note reviewed.  Constitutional:      Appearance: He is well-developed.  HENT:     Head: Normocephalic and atraumatic.  Eyes:     Conjunctiva/sclera: Conjunctivae normal.  Cardiovascular:     Rate and Rhythm: Normal rate and regular rhythm.     Heart sounds: No murmur heard. Pulmonary:     Effort: Pulmonary effort is normal. No respiratory distress.     Breath sounds: Normal breath sounds.  Abdominal:     Palpations: Abdomen is soft.     Tenderness: There is no abdominal tenderness. There is no right CVA tenderness or left CVA tenderness.  Musculoskeletal:     Cervical back: Neck supple.  Skin:    General: Skin is warm and dry.  Neurological:     Mental Status: He is alert.     UC Treatments / Results  Labs (all labs ordered are listed, but only abnormal results are displayed) Labs Reviewed  POCT URINALYSIS DIP (MANUAL ENTRY) - Abnormal; Notable for the following components:      Result Value   Color, UA red (*)    Bilirubin, UA moderate (*)    Ketones, POC UA small (15)  (*)    Blood, UA large (*)    Protein Ur, POC >=300 (*)    Nitrite, UA Positive (*)    Leukocytes, UA Large (3+) (*)  All other components within normal limits  URINE CULTURE    EKG   Radiology No results found.  Procedures Procedures (including critical care time)  Medications Ordered in UC Medications - No data to display  Initial Impression / Assessment and Plan / UC Course  I have reviewed the triage vital signs and the nursing notes.  Pertinent labs & imaging results that were available during my care of the patient were reviewed by me and considered in my medical decision making (see chart for details).     UTI-antibiotic prescribed.  Pt is afebrile with no dysuria, abdominal pain, or flank pain.  Advised to follow up with PCP for further evaluation.  Advised to return here or ED if he develops fever, chills, abdominal pain, or flank pain. Final Clinical Impressions(s) / UC Diagnoses   Final diagnoses:  Urinary tract infection with hematuria, site unspecified     Discharge Instructions      Take medication as prescribed Follow up with primary care physician  If you develop worsening or new symptoms return for evaluation.      ED Prescriptions     Medication Sig Dispense Auth. Provider   sulfamethoxazole-trimethoprim (BACTRIM DS) 800-160 MG tablet Take 1 tablet by mouth 2 (two) times daily for 7 days. 14 tablet Konrad Felix, PA-C      PDMP not reviewed this encounter.   Konrad Felix, PA-C 02/02/21 [phone]

## 2021-02-02 NOTE — Discharge Instructions (Addendum)
Take medication as prescribed Follow up with primary care physician  If you develop worsening or new symptoms return for evaluation.

## 2021-02-03 LAB — URINE CULTURE: Culture: <10000 — AB

## 2021-02-06 ENCOUNTER — Encounter: Payer: Self-pay | Admitting: Family Medicine

## 2021-02-06 ENCOUNTER — Other Ambulatory Visit: Payer: Self-pay

## 2021-02-06 ENCOUNTER — Ambulatory Visit (INDEPENDENT_AMBULATORY_CARE_PROVIDER_SITE_OTHER): Payer: PPO | Admitting: Family Medicine

## 2021-02-06 VITALS — BP 124/69 | HR 70 | Temp 98.0°F | Ht 67.0 in | Wt 165.6 lb

## 2021-02-06 DIAGNOSIS — R310 Gross hematuria: Secondary | ICD-10-CM

## 2021-02-06 LAB — CMP14+EGFR
ALT: 14 IU/L (ref 0–44)
AST: 19 IU/L (ref 0–40)
Albumin/Globulin Ratio: 1.5 (ref 1.2–2.2)
Albumin: 4.6 g/dL (ref 3.8–4.8)
Alkaline Phosphatase: 92 IU/L (ref 44–121)
BUN/Creatinine Ratio: 13 (ref 10–24)
BUN: 16 mg/dL (ref 8–27)
Bilirubin Total: 0.5 mg/dL (ref 0.0–1.2)
CO2: 22 mmol/L (ref 20–29)
Calcium: 9.4 mg/dL (ref 8.6–10.2)
Chloride: 100 mmol/L (ref 96–106)
Creatinine, Ser: 1.19 mg/dL (ref 0.76–1.27)
Globulin, Total: 3.1 g/dL (ref 1.5–4.5)
Glucose: 91 mg/dL (ref 65–99)
Potassium: 4.7 mmol/L (ref 3.5–5.2)
Sodium: 138 mmol/L (ref 134–144)
Total Protein: 7.7 g/dL (ref 6.0–8.5)
eGFR: 67 mL/min/{1.73_m2} (ref >59)

## 2021-02-06 LAB — URINALYSIS
Bilirubin, UA: NEGATIVE
Nitrite, UA: NEGATIVE
Specific Gravity, UA: 1.015 (ref 1.005–1.030)
Urobilinogen, Ur: 2 mg/dL — ABNORMAL HIGH (ref 0.2–1.0)
pH, UA: 5 (ref 5.0–7.5)

## 2021-02-06 LAB — CBC WITH DIFFERENTIAL/PLATELET
Basophils Absolute: 0 10*3/uL (ref 0.0–0.2)
Basos: 0 %
EOS (ABSOLUTE): 0 10*3/uL (ref 0.0–0.4)
Eos: 1 %
Hematocrit: 45 % (ref 37.5–51.0)
Hemoglobin: 15.2 g/dL (ref 13.0–17.7)
Immature Grans (Abs): 0 10*3/uL (ref 0.0–0.1)
Immature Granulocytes: 0 %
Lymphocytes Absolute: 0.5 10*3/uL — ABNORMAL LOW (ref 0.7–3.1)
Lymphs: 11 %
MCH: 30.5 pg (ref 26.6–33.0)
MCHC: 33.8 g/dL (ref 31.5–35.7)
MCV: 90 fL (ref 79–97)
Monocytes Absolute: 0.6 10*3/uL (ref 0.1–0.9)
Monocytes: 12 %
Neutrophils Absolute: 3.7 10*3/uL (ref 1.4–7.0)
Neutrophils: 76 %
Platelets: 211 10*3/uL (ref 150–450)
RBC: 4.99 x10E6/uL (ref 4.14–5.80)
RDW: 12.5 % (ref 11.6–15.4)
WBC: 4.8 10*3/uL (ref 3.4–10.8)

## 2021-02-06 MED ORDER — SULFAMETHOXAZOLE-TRIMETHOPRIM 800-160 MG PO TABS: 1.0000 | ORAL_TABLET | Freq: Two times a day (BID) | ORAL | 0 refills | Status: AC

## 2021-02-06 NOTE — Progress Notes (Signed)
Subjective:  Patient ID: William [name], male    DOB: 1951-04-18  Age: 70 y.o. MRN: [id number]  CC: Hematuria   HPI William [name] presents for Gross hematuria starting one day ago. Moderate dysuria. NKI.   Depression screen [facility] 2/9 02/06/2021 02/06/2021 12/04/2020  Decreased Interest 0 0 0  Down, Depressed, Hopeless 0 0 0  PHQ - 2 Score 0 0 0  Altered sleeping 2 - -  Tired, decreased energy 1 - -  Change in appetite 0 - -  Feeling bad or failure about yourself  0 - -  Trouble concentrating 0 - -  Moving slowly or fidgety/restless 0 - -  Suicidal thoughts 0 - -  PHQ-9 Score 3 - -  Difficult doing work/chores Not difficult at all - -    History William [name] has a past medical history of Allergy, GERD (gastroesophageal reflux disease), Hyperlipidemia, and Sarcoidosis (08/2013).   He has a past surgical history that includes Cholecystectomy; Video bronchoscopy (Bilateral, 09/14/2013); Colonoscopy; laparoscopic appendectomy (N/A, 04/06/2018); Appendectomy; Polypectomy; and Upper gastrointestinal endoscopy.   His family history includes Allergies in his father; Heart disease in his father.He reports that he has never smoked. He has never used smokeless tobacco. He reports that he does not drink alcohol and does not use drugs.    ROS Review of Systems  Constitutional:  Negative for fever.  Respiratory:  Negative for shortness of breath.   Cardiovascular:  Negative for chest pain.  Genitourinary:  Positive for dysuria and hematuria. Negative for frequency and urgency.  Musculoskeletal:  Negative for arthralgias.  Skin:  Negative for rash.   Objective:  BP 124/69   Pulse 70   Temp 98 F (36.7 C)   Ht '5\' 7"'  (1.702 m)   Wt 165 lb 9.6 oz (75.1 kg)   SpO2 98%   BMI 25.94 kg/m   BP Readings from Last 3 Encounters:  02/06/21 124/69  02/02/21 136/72  12/04/20 122/73    Wt Readings from Last 3 Encounters:  02/06/21 165 lb 9.6 oz (75.1 kg)  12/04/20 171 lb (77.6 kg)  04/18/20 171  lb (77.6 kg)     Physical Exam    Assessment & Plan:   William [name] was seen today for hematuria.  Diagnoses and all orders for this visit:  Gross hematuria -     CBC with Differential/Platelet -     CMP14+EGFR -     Urinalysis -     US RENAL; Future  Other orders -     sulfamethoxazole-trimethoprim (BACTRIM DS) 800-160 MG tablet; Take 1 tablet by mouth 2 (two) times daily for 7 days.      I am having William [name] maintain his fluticasone, acetaminophen, cholecalciferol, meloxicam, Probiotic Product (PROBIOTIC DAILY PO), multivitamin, Coenzyme Q10 (CO Q 10 PO), B Complex Vitamins (B COMPLEX 1 PO), (Saw Palmetto, Serenoa repens, (SAW PALMETTO PO)), Fish Oil, and sulfamethoxazole-trimethoprim. We will continue to administer sodium chloride.  Allergies as of 02/06/2021   No Known Allergies      Medication List        Accurate as of February 06, 2021 11:59 PM. If you have any questions, ask your nurse or doctor.          acetaminophen 325 MG tablet Commonly known as: TYLENOL Take 325 mg by mouth as needed for moderate pain.   B COMPLEX 1 PO Take by mouth.   cholecalciferol 10 MCG (400 UNIT) Tabs tablet Commonly known as: VITAMIN D3 Take 400 Units  by mouth daily.   CO Q 10 PO Take 100 mg by mouth daily.   Fish Oil 1000 MG Caps Take by mouth.   fluticasone 50 MCG/ACT nasal spray Commonly known as: FLONASE Place 2 sprays into both nostrils daily as needed for allergies or rhinitis.   meloxicam 7.5 MG tablet Commonly known as: MOBIC Take 7.5 mg by mouth daily.   multivitamin tablet Take 1 tablet by mouth daily.   PROBIOTIC DAILY PO Take by mouth.   SAW PALMETTO PO Take by mouth. Prostate plus   sulfamethoxazole-trimethoprim 800-160 MG tablet Commonly known as: BACTRIM DS Take 1 tablet by mouth 2 (two) times daily for 7 days.         Follow-up: No follow-ups on file.  Claretta Fraise, M.D.

## 2021-02-11 ENCOUNTER — Telehealth: Payer: Self-pay | Admitting: Family Medicine

## 2021-02-13 ENCOUNTER — Other Ambulatory Visit: Payer: Self-pay

## 2021-02-13 ENCOUNTER — Ambulatory Visit (HOSPITAL_COMMUNITY)
Admission: RE | Admit: 2021-02-13 | Discharge: 2021-02-13 | Disposition: A | Discharge status: Home / Self Care | Payer: PPO | Source: Ambulatory Visit | Attending: Family Medicine | Admitting: Family Medicine

## 2021-02-13 DIAGNOSIS — R319 Hematuria, unspecified: Secondary | ICD-10-CM | POA: Diagnosis not present

## 2021-02-13 DIAGNOSIS — N400 Enlarged prostate without lower urinary tract symptoms: Secondary | ICD-10-CM | POA: Diagnosis not present

## 2021-02-13 DIAGNOSIS — R310 Gross hematuria: Secondary | ICD-10-CM | POA: Insufficient documentation

## 2021-02-18 DIAGNOSIS — R310 Gross hematuria: Secondary | ICD-10-CM | POA: Diagnosis not present

## 2021-02-28 DIAGNOSIS — R310 Gross hematuria: Secondary | ICD-10-CM | POA: Diagnosis not present

## 2021-02-28 DIAGNOSIS — K429 Umbilical hernia without obstruction or gangrene: Secondary | ICD-10-CM | POA: Diagnosis not present

## 2021-02-28 DIAGNOSIS — N400 Enlarged prostate without lower urinary tract symptoms: Secondary | ICD-10-CM | POA: Diagnosis not present

## 2021-02-28 DIAGNOSIS — N200 Calculus of kidney: Secondary | ICD-10-CM | POA: Diagnosis not present

## 2021-04-23 ENCOUNTER — Ambulatory Visit: Payer: PPO | Admitting: Pulmonary Disease

## 2021-05-01 DIAGNOSIS — R310 Gross hematuria: Secondary | ICD-10-CM | POA: Diagnosis not present

## 2021-05-01 DIAGNOSIS — R972 Elevated prostate specific antigen [PSA]: Secondary | ICD-10-CM | POA: Diagnosis not present

## 2021-05-01 DIAGNOSIS — N200 Calculus of kidney: Secondary | ICD-10-CM | POA: Diagnosis not present

## 2021-05-07 ENCOUNTER — Telehealth: Payer: Self-pay | Admitting: Family Medicine

## 2021-05-07 NOTE — Telephone Encounter (Signed)
Left message for patient to call back and schedule Medicare Annual Wellness Visit (AWV) either virtually or in office.  *due 07/31/2020 awvi per palmetto  please schedule at anytime with health coach  This should be a 45 minute visit.

## 2021-05-13 ENCOUNTER — Ambulatory Visit (INDEPENDENT_AMBULATORY_CARE_PROVIDER_SITE_OTHER): Payer: PPO

## 2021-05-13 VITALS — Ht 67.0 in | Wt 164.0 lb

## 2021-05-13 DIAGNOSIS — Z0000 Encounter for general adult medical examination without abnormal findings: Secondary | ICD-10-CM

## 2021-05-13 NOTE — Patient Instructions (Signed)
William [name] , Thank you for taking time to come for your Medicare Wellness Visit. I appreciate your ongoing commitment to your health goals. Please review the following plan we discussed and let me know if I can assist you in the future.   Screening recommendations/referrals: Colonoscopy: Done 04/18/2020 Repeat in 2024. Recommended yearly ophthalmology/optometry visit for glaucoma screening and checkup Recommended yearly dental visit for hygiene and checkup  Vaccinations: Influenza vaccine: Done 03/2021 Repeat annually  Pneumococcal vaccine: Done 09/07/2015 and 11/14/2016 Tdap vaccine: Due Repeat in 10 years  Shingles vaccine: Shingrix discussed. Please contact your pharmacy for coverage information.     Covid-19: Please bring card to next visit for documentation in chart.   Advanced directives: Please bring a copy of your health care power of attorney and living will to the office to be added to your chart at your convenience.   Conditions/risks identified: KEEP UP THE GOOD WORK!!  Next appointment: Follow up in one year for your annual wellness visit. 2023.  Preventive Care 68 Years and Older, Male  Preventive care refers to lifestyle choices and visits with your health care provider that can promote health and wellness. What does preventive care include? A yearly physical exam. This is also called an annual well check. Dental exams once or twice a year. Routine eye exams. Ask your health care provider how often you should have your eyes checked. Personal lifestyle choices, including: Daily care of your teeth and gums. Regular physical activity. Eating a healthy diet. Avoiding tobacco and drug use. Limiting alcohol use. Practicing safe sex. Taking low doses of aspirin every day. Taking vitamin and mineral supplements as recommended by your health care provider. What happens during an annual well check? The services and screenings done by your health care provider during your  annual well check will depend on your age, overall health, lifestyle risk factors, and family history of disease. Counseling  Your health care provider may ask you questions about your: Alcohol use. Tobacco use. Drug use. Emotional well-being. Home and relationship well-being. Sexual activity. Eating habits. History of falls. Memory and ability to understand (cognition). Work and work Statistician. Screening  You may have the following tests or measurements: Height, weight, and BMI. Blood pressure. Lipid and cholesterol levels. These may be checked every 5 years, or more frequently if you are over 29 years old. Skin check. Lung cancer screening. You may have this screening every year starting at age 80 if you have a 30-pack-year history of smoking and currently smoke or have quit within the past 15 years. Fecal occult blood test (FOBT) of the stool. You may have this test every year starting at age 81. Flexible sigmoidoscopy or colonoscopy. You may have a sigmoidoscopy every 5 years or a colonoscopy every 10 years starting at age 67. Prostate cancer screening. Recommendations will vary depending on your family history and other risks. Hepatitis C blood test. Hepatitis B blood test. Sexually transmitted disease (STD) testing. Diabetes screening. This is done by checking your blood sugar (glucose) after you have not eaten for a while (fasting). You may have this done every 1-3 years. Abdominal aortic aneurysm (AAA) screening. You may need this if you are a current or former smoker. Osteoporosis. You may be screened starting at age 43 if you are at high risk. Talk with your health care provider about your test results, treatment options, and if necessary, the need for more tests. Vaccines  Your health care provider may recommend certain vaccines, such as: Influenza  vaccine. This is recommended every year. Tetanus, diphtheria, and acellular pertussis (Tdap, Td) vaccine. You may need a Td  booster every 10 years. Zoster vaccine. You may need this after age 55. Pneumococcal 13-valent conjugate (PCV13) vaccine. One dose is recommended after age 49. Pneumococcal polysaccharide (PPSV23) vaccine. One dose is recommended after age 75. Talk to your health care provider about which screenings and vaccines you need and how often you need them. This information is not intended to replace advice given to you by your health care provider. Make sure you discuss any questions you have with your health care provider. Document Released: 07/13/2015 Document Revised: 03/05/2016 Document Reviewed: 04/17/2015 Elsevier Interactive Patient Education  2017 [place] Prevention in the Home Falls can cause injuries. They can happen to people of all ages. There are many things you can do to make your home safe and to help prevent falls. What can I do on the outside of my home? Regularly fix the edges of walkways and driveways and fix any cracks. Remove anything that might make you trip as you walk through a door, such as a raised step or threshold. Trim any bushes or trees on the path to your home. Use bright outdoor lighting. Clear any walking paths of anything that might make someone trip, such as rocks or tools. Regularly check to see if handrails are loose or broken. Make sure that both sides of any steps have handrails. Any raised decks and porches should have guardrails on the edges. Have any leaves, snow, or ice cleared regularly. Use sand or salt on walking paths during winter. Clean up any spills in your garage right away. This includes oil or grease spills. What can I do in the bathroom? Use night lights. Install grab bars by the toilet and in the tub and shower. Do not use towel bars as grab bars. Use non-skid mats or decals in the tub or shower. If you need to sit down in the shower, use a plastic, non-slip stool. Keep the floor dry. Clean up any water that spills on the floor  as soon as it happens. Remove soap buildup in the tub or shower regularly. Attach bath mats securely with double-sided non-slip rug tape. Do not have throw rugs and other things on the floor that can make you trip. What can I do in the bedroom? Use night lights. Make sure that you have a light by your bed that is easy to reach. Do not use any sheets or blankets that are too big for your bed. They should not hang down onto the floor. Have a firm chair that has side arms. You can use this for support while you get dressed. Do not have throw rugs and other things on the floor that can make you trip. What can I do in the kitchen? Clean up any spills right away. Avoid walking on wet floors. Keep items that you use a lot in easy-to-reach places. If you need to reach something above you, use a strong step stool that has a grab bar. Keep electrical cords out of the way. Do not use floor polish or wax that makes floors slippery. If you must use wax, use non-skid floor wax. Do not have throw rugs and other things on the floor that can make you trip. What can I do with my stairs? Do not leave any items on the stairs. Make sure that there are handrails on both sides of the stairs and use them. Fix  handrails that are broken or loose. Make sure that handrails are as long as the stairways. Check any carpeting to make sure that it is firmly attached to the stairs. Fix any carpet that is loose or worn. Avoid having throw rugs at the top or bottom of the stairs. If you do have throw rugs, attach them to the floor with carpet tape. Make sure that you have a light switch at the top of the stairs and the bottom of the stairs. If you do not have them, ask someone to add them for you. What else can I do to help prevent falls? Wear shoes that: Do not have high heels. Have rubber bottoms. Are comfortable and fit you well. Are closed at the toe. Do not wear sandals. If you use a stepladder: Make sure that it is  fully opened. Do not climb a closed stepladder. Make sure that both sides of the stepladder are locked into place. Ask someone to hold it for you, if possible. Clearly mark and make sure that you can see: Any grab bars or handrails. First and last steps. Where the edge of each step is. Use tools that help you move around (mobility aids) if they are needed. These include: Canes. Walkers. Scooters. Crutches. Turn on the lights when you go into a dark area. Replace any light bulbs as soon as they burn out. Set up your furniture so you have a clear path. Avoid moving your furniture around. If any of your floors are uneven, fix them. If there are any pets around you, be aware of where they are. Review your medicines with your doctor. Some medicines can make you feel dizzy. This can increase your chance of falling. Ask your doctor what other things that you can do to help prevent falls. This information is not intended to replace advice given to you by your health care provider. Make sure you discuss any questions you have with your health care provider. Document Released: 04/12/2009 Document Revised: 11/22/2015 Document Reviewed: 07/21/2014 Elsevier Interactive Patient Education  2017 [employer].

## 2021-05-13 NOTE — Progress Notes (Signed)
Subjective:   William [name] is a 70 y.o. male who presents for an Initial Medicare Annual Wellness Visit. Virtual Visit via Telephone Note  I connected with  William [name] on 05/13/21 at  9:00 AM EST by telephone and verified that I am speaking with the correct person using two identifiers.  Location: Patient: Home Provider: [facility] Persons participating in the virtual visit: patient/Nurse Health Advisor   I discussed the limitations, risks, security and privacy concerns of performing an evaluation and management service by telephone and the availability of in person appointments. The patient expressed understanding and agreed to proceed.  Interactive audio and video telecommunications were attempted between this nurse and patient, however failed, due to patient having technical difficulties OR patient did not have access to video capability.  We continued and completed visit with audio only.  Some vital signs may be absent or patient reported.   Chriss Driver, LPN  Review of Systems     Cardiac Risk Factors include: advanced age (>14men, >53 women);male gender     Objective:    Today's Vitals   05/13/21 0852  Weight: 164 lb (74.4 kg)  Height: 5\' 7"  (1.702 m)   Body mass index is 25.69 kg/m.  Advanced Directives 05/13/2021 04/06/2018 04/06/2018 07/23/2016 09/14/2013  Does Patient Have a Medical Advance Directive? Yes No No Yes Patient has advance directive, copy not in chart  Type of Advance Directive [place];Living will - - - -  Copy of [place] in Chart? No - copy requested - - - -  Would patient like information on creating a medical advance directive? - No - Patient declined - - -    Current Medications (verified) Outpatient Encounter Medications as of 05/13/2021  Medication Sig   acetaminophen (TYLENOL) 325 MG tablet Take 325 mg by mouth as needed for moderate pain.   B Complex Vitamins (B COMPLEX 1 PO) Take by mouth.    cholecalciferol (VITAMIN D) 400 units TABS tablet Take 400 Units by mouth daily.   Coenzyme Q10 (CO Q 10 PO) Take 100 mg by mouth daily.   fluticasone (FLONASE) 50 MCG/ACT nasal spray Place 2 sprays into both nostrils daily as needed for allergies or rhinitis.    FLUZONE HIGH-DOSE QUADRIVALENT 0.7 ML SUSY    meloxicam (MOBIC) 7.5 MG tablet Take 7.5 mg by mouth daily.   Multiple Vitamin (MULTIVITAMIN) tablet Take 1 tablet by mouth daily.   Omega-3 Fatty Acids (FISH OIL) 1000 MG CAPS Take by mouth.   Probiotic Product (PROBIOTIC DAILY PO) Take by mouth.   Saw Palmetto, Serenoa repens, (SAW PALMETTO PO) Take by mouth. Prostate plus   SHINGRIX injection    Facility-Administered Encounter Medications as of 05/13/2021  Medication   0.9 %  sodium chloride infusion    Allergies (verified) Patient has no known allergies.   History: Past Medical History:  Diagnosis Date   Allergy    occ    GERD (gastroesophageal reflux disease)    past hx    Hyperlipidemia    Sarcoidosis 08/2013   Diagnosed with bronchoscopy. Pulmonary Involvement only.    Past Surgical History:  Procedure Laterality Date   APPENDECTOMY     CHOLECYSTECTOMY     COLONOSCOPY     LAPAROSCOPIC APPENDECTOMY N/A 04/06/2018   Procedure: APPENDECTOMY LAPAROSCOPIC;  Surgeon: Aviva Signs, MD;  Location: [facility];  Service: General;  Laterality: N/A;   POLYPECTOMY     UPPER GASTROINTESTINAL ENDOSCOPY  VIDEO BRONCHOSCOPY Bilateral 09/14/2013   Procedure: VIDEO BRONCHOSCOPY WITH FLUORO;  Surgeon: Kathee Delton, MD;  Location: [facility];  Service: Cardiopulmonary;  Laterality: Bilateral;   Family History  Problem Relation Age of Onset   Allergies Father    Heart disease Father    Lung disease Neg Hx    Rheumatologic disease Neg Hx    Colon cancer Neg Hx    Colon polyps Neg Hx    Esophageal cancer Neg Hx    Stomach cancer Neg Hx    Rectal cancer Neg Hx    Social History   Socioeconomic History   Marital status:  Married    Spouse name: Electrical engineer   Number of children: 0   Years of education: Not on file   Highest education level: Not on file  Occupational History   Occupation: English as a second language teacher: [employer]  Tobacco Use   Smoking status: Never   Smokeless tobacco: Never  Vaping Use   Vaping Use: Never used  Substance and Sexual Activity   Alcohol use: No   Drug use: No   Sexual activity: Not on file  Other Topics Concern   Not on file  Social History Narrative   Originally from [place]. Always lived in [place]. Previously traveled to [place]. No international travel. No pets currently. No bird, mold, or hot tub exposure. 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    Married x 50 years in June 2023.   Social Determinants of Health   Financial Resource Strain: Low Risk    Difficulty of Paying Living Expenses: Not hard at all  Food Insecurity: No Food Insecurity   Worried About [employer] fund[employer] in the Last Year: Never true   [place] in the Last Year: Never true  Transportation Needs: No Transportation Needs   Lack of Transportation (Medical): No   Lack of Transportation (Non-Medical): No  Physical Activity: Sufficiently Active   Days of Exercise per Week: 5 days   Minutes of Exercise per Session: 30 min  Stress: No Stress Concern Present   Feeling of Stress : Not at all  Social Connections: Socially Integrated   Frequency of Communication with Friends and Family: More than three times a week   Frequency of Social Gatherings with Friends and Family: More than three times a week   Attends Religious Services: More than 4 times per year   Active Member of [employer] or Organizations: Yes   Attends Music therapist: More than 4 times per year   Marital Status: Married    Tobacco Counseling Counseling given: Not Answered   Clinical Intake:  Pre-visit preparation completed: Yes  Pain : No/denies  pain     BMI - recorded: 25.69 Nutritional Status: BMI 25 -29 Overweight Nutritional Risks: None Diabetes: No  How often do you need to have someone help you when you read instructions, pamphlets, or other written materials from your doctor or pharmacy?: 1 - Never  Diabetic?No  Interpreter Needed?: No  Information entered by :: MJ Keara Pagliarulo, LPN   Activities of Daily Living In your present state of health, do you have any difficulty performing the following activities: 05/13/2021  Hearing? N  Vision? N  Difficulty concentrating or making decisions? N  Walking or climbing stairs? N  Dressing or bathing? N  Doing errands, shopping? N  Preparing Food and eating ? N  Using the  Toilet? N  In the past six months, have you accidently leaked urine? N  Do you have problems with loss of bowel control? N  Managing your Medications? N  Managing your Finances? N  Housekeeping or managing your Housekeeping? N  Some recent data might be hidden    Patient Care Team: Claretta Fraise, MD as PCP - General (Family Medicine)  Indicate any recent Medical Services you may have received from other than [facility] providers in the past year (date may be approximate).     Assessment:   This is a routine wellness examination for William [name].  Hearing/Vision screen Hearing Screening - Comments:: No hearing issues Vision Screening - Comments:: Glasses. [facility] in [place]. 2021.  Dietary issues and exercise activities discussed: Current Exercise Habits: Home exercise routine;Structured exercise class, Type of exercise: strength training/weights;treadmill;walking, Time (Minutes): 60, Frequency (Times/Week): 5, Weekly Exercise (Minutes/Week): 300, Intensity: Moderate, Exercise limited by: cardiac condition(s)   Goals Addressed             This Visit's Progress    Exercise 3x per week (30 min per time)       Pt states he wants to continue to work out and stay healthy.        Depression  Screen PHQ 2/9 Scores 05/13/2021 02/06/2021 02/06/2021 12/04/2020 04/06/2018 10/02/2017  PHQ - 2 Score 0 0 0 0 0 0  PHQ- 9 Score - 3 - - - -    Fall Risk Fall Risk  05/13/2021 02/06/2021 12/04/2020 04/06/2018 10/02/2017  Falls in the past year? 0 0 0 No No  Number falls in past yr: 0 - - - -  Injury with Fall? 0 - - - -  Risk for fall due to : Impaired vision - - History of fall(s) -  Follow up Falls prevention discussed - - - -    FALL RISK PREVENTION PERTAINING TO THE HOME:  Any stairs in or around the home? No  If so, are there any without handrails? No  Home free of loose throw rugs in walkways, pet beds, electrical cords, etc? Yes  Adequate lighting in your home to reduce risk of falls? Yes   ASSISTIVE DEVICES UTILIZED TO PREVENT FALLS:  Life alert? No  Use of a cane, walker or w/c? No  Grab bars in the bathroom? Yes  Shower chair or bench in shower? Yes  Elevated toilet seat or a handicapped toilet? Yes   TIMED UP AND GO:  Was the test performed? No . Phone visit.   Cognitive Function:     6CIT Screen 05/13/2021  What Year? 0 points  What month? 0 points  What time? 0 points  Count back from 20 0 points  Months in reverse 2 points  Repeat phrase 0 points  Total Score 2    Immunizations Immunization History  Administered Date(s) Administered   Influenza Split 03/21/2017   Influenza,inj,Quad PF,6+ Mos 04/21/2013, 04/17/2017, 02/23/2019   Influenza-Unspecified 03/30/2014, 04/10/2015, 04/08/2016, 03/23/2018   Pneumococcal Conjugate-13 11/14/2016   Pneumococcal Polysaccharide-23 09/07/2015    TDAP status: Due, Education has been provided regarding the importance of this vaccine. Advised may receive this vaccine at local pharmacy or Health Dept. Aware to provide a copy of the vaccination record if obtained from local pharmacy or Health Dept. Verbalized acceptance and understanding.  Flu Vaccine status: Up to date  Pneumococcal vaccine status: Up to date  Covid-19  vaccine status: Information provided on how to obtain vaccines.   Qualifies for Shingles Vaccine?  Yes   Zostavax completed  Pt states he has completed both new Shingrix. Asked pt to bring documentation to next visit.    Shingrix Completed?: Yes  Screening Tests Health Maintenance  Topic Date Due   COVID-19 Vaccine (1) Never done   Hepatitis C Screening  Never done   TETANUS/TDAP  Never done   Zoster Vaccines- Shingrix (1 of 2) Never done   Pneumonia Vaccine 88+ Years old (3 - PPSV23 if available, else PCV20) 09/06/2020   INFLUENZA VACCINE  01/28/2021   COLONOSCOPY (Pts 45-74yrs Insurance coverage will need to be confirmed)  04/19/2023   HPV VACCINES  Aged Out    Health Maintenance  Health Maintenance Due  Topic Date Due   COVID-19 Vaccine (1) Never done   Hepatitis C Screening  Never done   TETANUS/TDAP  Never done   Zoster Vaccines- Shingrix (1 of 2) Never done   Pneumonia Vaccine 68+ Years old (3 - PPSV23 if available, else PCV20) 09/06/2020   INFLUENZA VACCINE  01/28/2021    Colorectal cancer screening: Type of screening: Colonoscopy. Completed 04/18/2020. Repeat every 3 years  Lung Cancer Screening: (Low Dose CT Chest recommended if Age 104-80 years, 30 pack-year currently smoking OR have quit w/in 15years.) does not qualify.  Non-Smoker  Additional Screening:  Hepatitis C Screening: does qualify; Completed Not done yet.  Vision Screening: Recommended annual ophthalmology exams for early detection of glaucoma and other disorders of the eye. Is the patient up to date with their annual eye exam?  Yes  Who is the provider or what is the name of the office in which the patient attends annual eye exams? [facility]. If pt is not established with a provider, would they like to be referred to a provider to establish care? No .   Dental Screening: Recommended annual dental exams for proper oral hygiene  Community Resource Referral / Chronic Care Management: CRR required  this visit?  No   CCM required this visit?  No      Plan:     I have personally reviewed and noted the following in the patient's chart:   Medical and social history Use of alcohol, tobacco or illicit drugs  Current medications and supplements including opioid prescriptions. Patient is not currently taking opioid prescriptions. Functional ability and status Nutritional status Physical activity Advanced directives List of other physicians Hospitalizations, surgeries, and ER visits in previous 12 months Vitals Screenings to include cognitive, depression, and falls Referrals and appointments  In addition, I have reviewed and discussed with patient certain preventive protocols, quality metrics, and best practice recommendations. A written personalized care plan for preventive services as well as general preventive health recommendations were provided to patient.     Chriss Driver, LPN   16/06/930   Nurse Notes: Pt states he has completed Shingrix, Covid and Flu vaccines. Requested that pt please bring documentation to next visit, so that vaccines can be entered into chart. Pt verbalized understanding. Colonoscopy completed in 2021, repeat due 2024.

## 2021-05-14 ENCOUNTER — Ambulatory Visit: Payer: PPO | Admitting: Pulmonary Disease

## 2021-05-17 ENCOUNTER — Ambulatory Visit (INDEPENDENT_AMBULATORY_CARE_PROVIDER_SITE_OTHER): Payer: PPO

## 2021-05-17 ENCOUNTER — Encounter: Payer: Self-pay | Admitting: Pulmonary Disease

## 2021-05-17 ENCOUNTER — Ambulatory Visit: Payer: PPO | Admitting: Pulmonary Disease

## 2021-05-17 ENCOUNTER — Other Ambulatory Visit: Payer: Self-pay

## 2021-05-17 VITALS — BP 110/72 | HR 60 | Temp 97.7°F | Ht 67.0 in | Wt 165.4 lb

## 2021-05-17 DIAGNOSIS — D869 Sarcoidosis, unspecified: Secondary | ICD-10-CM | POA: Diagnosis not present

## 2021-05-17 DIAGNOSIS — D860 Sarcoidosis of lung: Secondary | ICD-10-CM | POA: Diagnosis not present

## 2021-05-17 NOTE — Progress Notes (Signed)
Called pt and there was no answer-LMTCB °

## 2021-05-17 NOTE — Patient Instructions (Signed)
CXR today Schedule PFTs

## 2021-05-17 NOTE — Progress Notes (Signed)
   Subjective:    Patient ID: Epimenio Foot, male    DOB: [birth year], 70 y.o.   MRN: [id number]  HPI  70 yo never smoker with sarcoidosis  for annual follow-up. He was diagnosed in 2015 when he presented with masslike consolidation in the left upper lobe and hilar lymphadenopathy and underwent bronchoscopic biopsy.   Annual follow-up visit, denies cough or shortness of breath, no skin lesions. 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 He is vaccinated and boosted, immunizations up-to-date    Significant tests/ events reviewed PFT 10/2017 PFT >> ratio 76, FEV1 65%, FVC 64%, TLC 83%, DLCO 76%   11/14/16: FVC 2.98 L (66%) FEV1 2.07 L (62%) FEV1/FVC 0.70  negative bronchodilator response TLC 5.20 L (76%) RV 92% ERV 116% DLCO corrected 72%   11/07/15: FVC 3.22 L (71%) FEV1 2.28 L (67%) FEV1/FVC 0.71  negative bronchodilator response TLC 4.27 L (62%) RV 22% ERV 215% DLCO corrected 84%     CT CHEST W/O 01/04/14: No pleural effusion or thickening. Reduce size of left upper lobe consolidation/mass. Diffuse peri-bronchovascular nodularity. Right hilar lymph node measuring approximately 2 cm. No other nodule or mass appreciated. No pericardial effusion. Patulous esophagus.     PATHOLOGY BRUSH LUL (09/14/13): Reactive bronchial epithelial cells with fragments of granuloma. No atypia or malignancy. BAL LUL (09/14/13): Rare atypical cells. Abundant histiocytes. Acute and chronic inflammation. FNA & TBBx LUL (09/14/13): Chronic nonnecrotizing granulomatous inflammation with granulomata. AFB, GMS, & PAS stains negative. No atypia or malignancy  Review of Systems neg for any significant sore throat, dysphagia, itching, sneezing, nasal congestion or excess/ purulent secretions, fever, chills, sweats, unintended wt loss, pleuritic or exertional cp, hempoptysis, orthopnea pnd or change in chronic leg swelling. Also denies presyncope, palpitations, heartburn,  abdominal pain, nausea, vomiting, diarrhea or change in bowel or urinary habits, dysuria,hematuria, rash, arthralgias, visual complaints, headache, numbness weakness or ataxia.     Objective:   Physical Exam  Gen. Pleasant, well-nourished, in no distress ENT - no thrush, no pallor/icterus,no post nasal drip Neck: No JVD, no thyromegaly, no carotid bruits Lungs: no use of accessory muscles, no dullness to percussion, clear without rales or rhonchi  Cardiovascular: Rhythm regular, heart sounds  normal, no murmurs or gallops, no peripheral edema Musculoskeletal: No deformities, no cyanosis or clubbing        Assessment & Plan:

## 2021-05-17 NOTE — Assessment & Plan Note (Signed)
Appears stable by history. Chest x-rays obtained and reviewed which shows minimal left upper lobe scarring and mild hyperinflation PFTs will be scheduled

## 2021-07-04 ENCOUNTER — Ambulatory Visit (INDEPENDENT_AMBULATORY_CARE_PROVIDER_SITE_OTHER): Payer: PPO | Admitting: Pulmonary Disease

## 2021-07-04 ENCOUNTER — Other Ambulatory Visit: Payer: Self-pay

## 2021-07-04 DIAGNOSIS — D860 Sarcoidosis of lung: Secondary | ICD-10-CM

## 2021-07-04 LAB — PULMONARY FUNCTION TEST
DL/VA % pred: 129 %
DL/VA: 5.32 ml/min/mmHg/L
DLCO cor % pred: 89 %
DLCO cor: 22.82 ml/min/mmHg
DLCO unc % pred: 89 %
DLCO unc: 22.82 ml/min/mmHg
FEF 25-75 Post: 1.66 L/sec
FEF 25-75 Pre: 1.04 L/sec
FEF2575-%Change-Post: 58 %
FEF2575-%Pred-Post: 66 %
FEF2575-%Pred-Pre: 42 %
FEV1-%Change-Post: 12 %
FEV1-%Pred-Post: 67 %
FEV1-%Pred-Pre: 60 %
FEV1-Post: 2.18 L
FEV1-Pre: 1.94 L
FEV1FVC-%Change-Post: 9 %
FEV1FVC-%Pred-Pre: 91 %
FEV6-%Change-Post: 2 %
FEV6-%Pred-Post: 70 %
FEV6-%Pred-Pre: 68 %
FEV6-Post: 2.9 L
FEV6-Pre: 2.82 L
FEV6FVC-%Change-Post: 0 %
FEV6FVC-%Pred-Post: 105 %
FEV6FVC-%Pred-Pre: 104 %
FVC-%Change-Post: 2 %
FVC-%Pred-Post: 67 %
FVC-%Pred-Pre: 65 %
FVC-Post: 2.91 L
FVC-Pre: 2.85 L
Post FEV1/FVC ratio: 75 %
Post FEV6/FVC ratio: 100 %
Pre FEV1/FVC ratio: 68 %
Pre FEV6/FVC Ratio: 99 %
RV % pred: 111 %
RV: 2.62 L
TLC % pred: 83 %
TLC: 5.69 L

## 2021-07-04 NOTE — Progress Notes (Signed)
PFT done today. 

## 2021-08-05 DIAGNOSIS — N401 Enlarged prostate with lower urinary tract symptoms: Secondary | ICD-10-CM | POA: Diagnosis not present

## 2021-08-12 DIAGNOSIS — R972 Elevated prostate specific antigen [PSA]: Secondary | ICD-10-CM | POA: Diagnosis not present

## 2021-08-12 DIAGNOSIS — N401 Enlarged prostate with lower urinary tract symptoms: Secondary | ICD-10-CM | POA: Diagnosis not present

## 2021-08-12 DIAGNOSIS — N200 Calculus of kidney: Secondary | ICD-10-CM | POA: Diagnosis not present

## 2021-08-12 DIAGNOSIS — R351 Nocturia: Secondary | ICD-10-CM | POA: Diagnosis not present

## 2021-10-29 DIAGNOSIS — R972 Elevated prostate specific antigen [PSA]: Secondary | ICD-10-CM | POA: Diagnosis not present

## 2021-11-05 DIAGNOSIS — R972 Elevated prostate specific antigen [PSA]: Secondary | ICD-10-CM | POA: Diagnosis not present

## 2021-11-05 DIAGNOSIS — R351 Nocturia: Secondary | ICD-10-CM | POA: Diagnosis not present

## 2021-11-05 DIAGNOSIS — N401 Enlarged prostate with lower urinary tract symptoms: Secondary | ICD-10-CM | POA: Diagnosis not present

## 2021-11-05 DIAGNOSIS — N200 Calculus of kidney: Secondary | ICD-10-CM | POA: Diagnosis not present

## 2022-01-06 ENCOUNTER — Encounter: Payer: Self-pay | Admitting: Pulmonary Disease

## 2022-01-06 ENCOUNTER — Ambulatory Visit: Payer: PPO | Admitting: Pulmonary Disease

## 2022-01-06 DIAGNOSIS — H524 Presbyopia: Secondary | ICD-10-CM | POA: Diagnosis not present

## 2022-01-06 DIAGNOSIS — D860 Sarcoidosis of lung: Secondary | ICD-10-CM

## 2022-01-06 NOTE — Progress Notes (Signed)
   Subjective:    Patient ID: William [name], male    DOB: 12/05/1950, 71 y.o.   MRN: [id number]  HPI  71 yo never smoker with sarcoidosis  for annual follow-up. He was diagnosed in 2015 when he presented with masslike consolidation in the left upper lobe and hilar lymphadenopathy and underwent bronchoscopic biopsy.  Since then, left upper lobe has resolved to minimal scarring.  Reviewed last chest x-ray from last office visit 04/2021 which appears unchanged compared to 2021.  He denies coughing or shortness of breath.  No new skin lesions.  Vision okay. 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 Reduce size of left upper lobe consolidation/mass. Diffuse peri-bronchovascular nodularity. Right hilar lymph node measuring approximately 2 cm. No other nodule or mass appreciated. No pericardial effusion. Patulous esophagus.     PATHOLOGY BRUSH LUL (09/14/13): Reactive bronchial epithelial cells with fragments of granuloma. No atypia or malignancy. BAL LUL (09/14/13): Rare atypical cells. Abundant histiocytes. Acute and chronic inflammation. FNA & TBBx LUL (09/14/13): Chronic nonnecrotizing granulomatous inflammation with granulomata. AFB, GMS, & PAS stains negative. No atypia or malignancy    Review of Systems neg for any significant sore throat, dysphagia, itching, sneezing, nasal congestion or excess/ purulent secretions, fever, chills, sweats,  unintended wt loss, pleuritic or exertional cp, hempoptysis, orthopnea pnd or change in chronic leg swelling. Also denies presyncope, palpitations, heartburn, abdominal pain, nausea, vomiting, diarrhea or change in bowel or urinary habits, dysuria,hematuria, rash, arthralgias, visual complaints, headache, numbness weakness or ataxia.     Objective:   Physical Exam  Gen. Pleasant, well-nourished, in no distress ENT - no thrush, no pallor/icterus,no post nasal drip Neck: No JVD, no thyromegaly, no carotid bruits Lungs: no use of accessory muscles, no dullness to percussion, clear without rales or rhonchi  Cardiovascular: Rhythm regular, heart sounds  normal, no murmurs or gallops, no peripheral edema Musculoskeletal: No deformities, no cyanosis or clubbing         Assessment & Plan:   Bradycardia -appears chronic on review of vital signs on previous visits. EKG from 2018 also shows heart rate of 60, sinus

## 2022-01-06 NOTE — Assessment & Plan Note (Signed)
We reviewed PFTs and chest x-ray.  Lung function appears stable compared to prior. Sarcoidosis seems to be in remission

## 2022-01-07 MED ORDER — FLUTICASONE PROPIONATE 50 MCG/ACT NA SUSP: 2.0000 | Freq: Every day | NASAL | 3 refills | Status: DC | PRN

## 2022-01-07 NOTE — Addendum Note (Signed)
Addended by: Konrad Felix L on: 01/07/2022 09:16 AM   Modules accepted: Orders

## 2022-05-14 DIAGNOSIS — R972 Elevated prostate specific antigen [PSA]: Secondary | ICD-10-CM | POA: Diagnosis not present

## 2022-05-21 DIAGNOSIS — N401 Enlarged prostate with lower urinary tract symptoms: Secondary | ICD-10-CM | POA: Diagnosis not present

## 2022-05-21 DIAGNOSIS — Q631 Lobulated, fused and horseshoe kidney: Secondary | ICD-10-CM | POA: Diagnosis not present

## 2022-05-21 DIAGNOSIS — N200 Calculus of kidney: Secondary | ICD-10-CM | POA: Diagnosis not present

## 2022-05-21 DIAGNOSIS — R972 Elevated prostate specific antigen [PSA]: Secondary | ICD-10-CM | POA: Diagnosis not present

## 2022-05-21 DIAGNOSIS — R351 Nocturia: Secondary | ICD-10-CM | POA: Diagnosis not present

## 2022-06-28 ENCOUNTER — Encounter (HOSPITAL_BASED_OUTPATIENT_CLINIC_OR_DEPARTMENT_OTHER): Payer: Self-pay | Admitting: Pulmonary Disease

## 2022-07-04 ENCOUNTER — Encounter: Payer: Self-pay | Admitting: Family Medicine

## 2022-07-04 ENCOUNTER — Ambulatory Visit (INDEPENDENT_AMBULATORY_CARE_PROVIDER_SITE_OTHER): Payer: PPO

## 2022-07-04 ENCOUNTER — Ambulatory Visit: Payer: PPO | Admitting: Family Medicine

## 2022-07-04 ENCOUNTER — Telehealth: Payer: Self-pay | Admitting: Family Medicine

## 2022-07-04 ENCOUNTER — Ambulatory Visit (INDEPENDENT_AMBULATORY_CARE_PROVIDER_SITE_OTHER): Payer: PPO | Admitting: Family Medicine

## 2022-07-04 VITALS — BP 102/57 | HR 68 | Temp 98.1°F | Ht 67.0 in | Wt 169.2 lb

## 2022-07-04 DIAGNOSIS — M25461 Effusion, right knee: Secondary | ICD-10-CM | POA: Diagnosis not present

## 2022-07-04 DIAGNOSIS — M25561 Pain in right knee: Secondary | ICD-10-CM | POA: Diagnosis not present

## 2022-07-04 MED ORDER — MELOXICAM 7.5 MG PO TABS: 7.5000 mg | ORAL_TABLET | Freq: Every day | ORAL | 0 refills | Status: DC

## 2022-07-04 NOTE — Patient Instructions (Signed)
Acute Knee Pain, Adult Acute knee pain is sudden and may be caused by damage, swelling, or irritation of the muscles and tissues that support the knee. Pain may result from: A fall. An injury to the knee from twisting motions. A hit to the knee. Infection. Acute knee pain may go away on its own with time and rest. If it does not, your health care provider may order tests to find the cause of the pain. These may include: Imaging tests, such as an X-ray, MRI, CT scan, or ultrasound. Joint aspiration. In this test, fluid is removed from the knee and evaluated. Arthroscopy. In this test, a lighted tube is inserted into the knee and an image is projected onto a TV screen. Biopsy. In this test, a sample of tissue is removed from the body and studied under a microscope. Follow these instructions at home: If you have a knee sleeve or brace:  Wear the knee sleeve or brace as told by your health care provider. Remove it only as told by your health care provider. Loosen it if your toes tingle, become numb, or turn cold and blue. Keep it clean. If the knee sleeve or brace is not waterproof: Do not let it get wet. Cover it with a watertight covering when you take a bath or shower. Activity Rest your knee. Do not do things that cause pain or make pain worse. Avoid high-impact activities or exercises, such as running, jumping rope, or doing jumping jacks. Work with a physical therapist to make a safe exercise program, as recommended by your health care provider. Do exercises as told by your physical therapist. Managing pain, stiffness, and swelling  If directed, put ice on the affected knee. To do this: If you have a removable knee sleeve or brace, remove it as told by your health care provider. Put ice in a plastic bag. Place a towel between your skin and the bag. Leave the ice on for 20 minutes, 2-3 times a day. Remove the ice if your skin turns bright red. This is very important. If you cannot  feel pain, heat, or cold, you have a greater risk of damage to the area. If directed, use an elastic bandage to put pressure (compression) on your injured knee. This may control swelling, give support, and help with discomfort. Raise (elevate) your knee above the level of your heart while you are sitting or lying down. Sleep with a pillow under your knee. General instructions Take over-the-counter and prescription medicines only as told by your health care provider. Do not use any products that contain nicotine or tobacco, such as cigarettes, e-cigarettes, and chewing tobacco. If you need help quitting, ask your health care provider. If you are overweight, work with your health care provider and a dietitian to set a weight-loss goal that is healthy and reasonable for you. Extra weight can put pressure on your knee. Pay attention to any changes in your symptoms. Keep all follow-up visits. This is important. Contact a health care provider if: Your knee pain continues, changes, or gets worse. You have a fever along with knee pain. Your knee feels warm to the touch or is red. Your knee buckles or locks up. Get help right away if: Your knee swells, and the swelling becomes worse. You cannot move your knee. You have severe pain in your knee that cannot be managed with pain medicine. Summary Acute knee pain can be caused by a fall, an injury, an infection, or damage, swelling, or irritation   of the tissues that support your knee. Your health care provider may perform tests to find out the cause of the pain. Pay attention to any changes in your symptoms. Relieve your pain with rest, medicines, light activity, and the use of ice. Get help right away if your knee swells, you cannot move your knee, or you have severe pain that cannot be managed with medicine. This information is not intended to replace advice given to you by your health care provider. Make sure you discuss any questions you have with  your health care provider. Document Revised: 11/30/2019 Document Reviewed: 11/30/2019 Elsevier Patient Education  2023 [employer]  

## 2022-07-04 NOTE — Progress Notes (Unsigned)
   Acute Office Visit  Subjective:     Patient ID: William [name], male    DOB: 03-26-1951, 72 y.o.   MRN: [id number]  Chief Complaint  Patient presents with   Knee Pain    Knee Pain  Incident onset: 3 weeks ago. Injury mechanism: pain started when getting out of his truck. The pain is present in the right knee. The quality of the pain is described as shooting. The pain has been Fluctuating since onset. Pertinent negatives include no inability to bear weight, numbness or tingling. Associated symptoms comments: Swelling- improving. The symptoms are aggravated by palpation, movement and weight bearing. He has tried elevation, heat, ice, immobilization, rest and acetaminophen for the symptoms. The treatment provided mild relief.   ROS  As per HPI.       Objective:    BP (!) 102/57   Pulse 68   Temp 98.1 F (36.7 C) (Temporal)   Ht '5\' 7"'$  (1.702 m)   Wt 169 lb 4 oz (76.8 kg)   SpO2 96%   BMI 26.51 kg/m  {Vitals History (Optional):23777}  Physical Exam Vitals and nursing note reviewed.     No results found for any visits on 07/04/22.      Assessment & Plan:   Problem List Items Addressed This Visit   None   No orders of the defined types were placed in this encounter.   No follow-ups on file.  Gwenlyn Perking, FNP

## 2022-07-14 DIAGNOSIS — M1711 Unilateral primary osteoarthritis, right knee: Secondary | ICD-10-CM | POA: Diagnosis not present

## 2022-07-14 DIAGNOSIS — M25561 Pain in right knee: Secondary | ICD-10-CM | POA: Diagnosis not present

## 2022-07-14 DIAGNOSIS — M2391 Unspecified internal derangement of right knee: Secondary | ICD-10-CM | POA: Diagnosis not present

## 2022-07-31 ENCOUNTER — Ambulatory Visit (INDEPENDENT_AMBULATORY_CARE_PROVIDER_SITE_OTHER): Payer: HMO | Admitting: Family Medicine

## 2022-07-31 ENCOUNTER — Encounter: Payer: Self-pay | Admitting: Family Medicine

## 2022-07-31 VITALS — BP 115/63 | HR 86 | Temp 97.4°F | Ht 67.0 in | Wt 167.5 lb

## 2022-07-31 DIAGNOSIS — J329 Chronic sinusitis, unspecified: Secondary | ICD-10-CM | POA: Diagnosis not present

## 2022-07-31 DIAGNOSIS — J40 Bronchitis, not specified as acute or chronic: Secondary | ICD-10-CM | POA: Diagnosis not present

## 2022-07-31 DIAGNOSIS — D860 Sarcoidosis of lung: Secondary | ICD-10-CM | POA: Diagnosis not present

## 2022-07-31 MED ORDER — AMOXICILLIN-POT CLAVULANATE 875-125 MG PO TABS: 1.0000 | ORAL_TABLET | Freq: Two times a day (BID) | ORAL | 0 refills | Status: AC

## 2022-07-31 NOTE — Progress Notes (Signed)
   Acute Office Visit  Subjective:     Patient ID: William [name], male    DOB: 21-Sep-1950, 72 y.o.   MRN: [id number]  Chief Complaint  Patient presents with   Cough    Cough This is a new problem. Episode onset: 1 week ago. The problem has been unchanged. The cough is Productive of sputum (yellow). Associated symptoms include headaches and nasal congestion. Pertinent negatives include no chest pain, chills, ear congestion, ear pain, fever, sore throat or shortness of breath. He has tried OTC cough suppressant for the symptoms. The treatment provided mild relief. His past medical history is significant for pneumonia.    Review of Systems  Constitutional:  Negative for chills and fever.  HENT:  Negative for ear pain and sore throat.   Respiratory:  Positive for cough. Negative for shortness of breath.   Cardiovascular:  Negative for chest pain.  Neurological:  Positive for headaches.        Objective:    BP 115/63   Pulse 86   Temp (!) 97.4 F (36.3 C) (Oral)   Ht '5\' 7"'$  (1.702 m)   Wt 167 lb 8 oz (76 kg)   SpO2 95%   BMI 26.23 kg/m    Physical Exam Vitals and nursing note reviewed.  Constitutional:      General: He is not in acute distress.    Appearance: He is not ill-appearing, toxic-appearing or diaphoretic.  HENT:     Head: Normocephalic and atraumatic.     Right Ear: Tympanic membrane, ear canal and external ear normal.     Left Ear: Tympanic membrane and external ear normal.     Nose: Congestion present.     Right Sinus: No maxillary sinus tenderness or frontal sinus tenderness.     Left Sinus: No maxillary sinus tenderness or frontal sinus tenderness.     Mouth/Throat:     Mouth: Mucous membranes are moist.     Pharynx: Oropharynx is clear.  Cardiovascular:     Rate and Rhythm: Normal rate and regular rhythm.     Heart sounds: Normal heart sounds. No murmur heard. Pulmonary:     Effort: Pulmonary effort is normal. No respiratory distress.     Breath  sounds: Normal breath sounds. No wheezing, rhonchi or rales.  Musculoskeletal:     Cervical back: Neck supple. No rigidity.     Right lower leg: No edema.     Left lower leg: No edema.  Skin:    General: Skin is warm and dry.  Neurological:     General: No focal deficit present.     Mental Status: He is alert and oriented to person, place, and time.  Psychiatric:        Mood and Affect: Mood normal.        Behavior: Behavior normal.     No results found for any visits on 07/31/22.      Assessment & Plan:   Shameek was seen today for cough.  Diagnoses and all orders for this visit:  Sinobronchitis Pulmonary sarcoidosis ([place]) Lungs clear on exam today. Augmentin as below. Discussed symptomatic care and return precautions.  -     amoxicillin-clavulanate (AUGMENTIN) 875-125 MG tablet; Take 1 tablet by mouth 2 (two) times daily for 7 days.   The patient indicates understanding of these issues and agrees with the plan.  Gwenlyn Perking, FNP

## 2022-08-01 ENCOUNTER — Telehealth: Payer: Self-pay | Admitting: Family Medicine

## 2022-08-01 NOTE — Telephone Encounter (Signed)
Patient would like to speak to nurse. He was seen on 2/1 and given medication but still has a fever. Please call back.

## 2022-08-01 NOTE — Telephone Encounter (Signed)
Pt called back to see if nurse got his message. I asked pt what his fever was running and he said 99.4. I explained to him that is considered a low grade fever and advised to keep taking medicine that was prescribed to him because it takes time to get in his system. Pt voiced understanding but still wants to talk to nurse.

## 2022-08-01 NOTE — Telephone Encounter (Signed)
I spoke to pt and he states he has a temp of 99.4 and wants to know if that is ok. Advised pt it is normal and to continue taking antibiotic and to monitor and pt voiced understanding.

## 2022-08-07 ENCOUNTER — Telehealth: Payer: Self-pay | Admitting: Family Medicine

## 2022-08-07 NOTE — Telephone Encounter (Signed)
Left message for patient to call back and schedule Medicare Annual Wellness Visit (AWV) to be completed by video or phone.   Last AWV: 05/13/2021   Please schedule at anytime with [facility] Nurse Health Advisor     Any questions, please contact me at [phone]   Thank you,   Stephanie Ambulatory Clinical Support for [facility]  Care Management /[facility] You Are. We Are. [facility] ??[phone] or ??[phone]    

## 2022-08-11 ENCOUNTER — Telehealth: Payer: Self-pay | Admitting: Family Medicine

## 2022-08-11 NOTE — Telephone Encounter (Signed)
Left message for patient to call back and schedule Medicare Annual Wellness Visit (AWV) to be completed by video or phone.   Last AWV: 05/13/2021   Please schedule at anytime with [place]     Any questions, please contact me at [phone]   Thank you,   [facility] Ambulatory Clinical Support for [place] Are. We Are. [facility] ??[phone] or ??[phone]

## 2022-08-14 NOTE — Telephone Encounter (Signed)
Pt refused.

## 2022-11-12 DIAGNOSIS — R972 Elevated prostate specific antigen [PSA]: Secondary | ICD-10-CM | POA: Diagnosis not present

## 2022-11-19 ENCOUNTER — Telehealth: Payer: Self-pay | Admitting: *Deleted

## 2022-11-19 DIAGNOSIS — N200 Calculus of kidney: Secondary | ICD-10-CM | POA: Diagnosis not present

## 2022-11-19 DIAGNOSIS — N401 Enlarged prostate with lower urinary tract symptoms: Secondary | ICD-10-CM | POA: Diagnosis not present

## 2022-11-19 DIAGNOSIS — R351 Nocturia: Secondary | ICD-10-CM | POA: Diagnosis not present

## 2022-11-19 DIAGNOSIS — R972 Elevated prostate specific antigen [PSA]: Secondary | ICD-10-CM | POA: Diagnosis not present

## 2022-11-19 NOTE — Telephone Encounter (Signed)
-----   Message from Beverley Fiedler, MD sent at 11/19/2022  2:31 PM EDT ----- If patient agreeable we can proceed with surveillance colonoscopy Jmp  ----- Message ----- From: Bjorn Pippin, MD Sent: 11/19/2022   9:00 AM EDT To: Beverley Fiedler, MD  William [name] is supposed to have a colonoscopy with you this year.  I saw him today for his urologic f/u and noted on exam and 3-4cm wide band of smooth, erythematous skin around the anus.  He has no symptoms with this, but I have no idea what to make of it and wanted to let you know so you could evaluate it at the time of his colonoscopy.

## 2022-11-19 NOTE — Telephone Encounter (Signed)
Left voicemail for patient to call back. He can be scheduled for direct recall colonoscopy with previsit.

## 2022-11-19 NOTE — Telephone Encounter (Signed)
Patient called back. He has scheduled a previsit for 01/02/23 at 8:00 am and colonoscopy for 01/20/23 at 2:00 pm.

## 2022-12-09 ENCOUNTER — Encounter (HOSPITAL_BASED_OUTPATIENT_CLINIC_OR_DEPARTMENT_OTHER): Payer: Self-pay | Admitting: Pulmonary Disease

## 2022-12-09 ENCOUNTER — Telehealth: Payer: Self-pay | Admitting: Pulmonary Disease

## 2022-12-09 NOTE — Telephone Encounter (Signed)
Patient called to ask if it is ok for him to get the RSV vaccine.  Please call patient to discuss.  CB# [phone]

## 2022-12-17 ENCOUNTER — Encounter: Payer: Self-pay | Admitting: Family Medicine

## 2022-12-17 ENCOUNTER — Ambulatory Visit (INDEPENDENT_AMBULATORY_CARE_PROVIDER_SITE_OTHER): Payer: HMO | Admitting: Family Medicine

## 2022-12-17 VITALS — BP 130/82 | HR 67 | Temp 98.6°F | Ht 67.0 in | Wt 167.8 lb

## 2022-12-17 DIAGNOSIS — J069 Acute upper respiratory infection, unspecified: Secondary | ICD-10-CM

## 2022-12-17 MED ORDER — PREDNISONE 20 MG PO TABS: 40.0000 mg | ORAL_TABLET | Freq: Every day | ORAL | 0 refills | Status: AC

## 2022-12-17 MED ORDER — ALBUTEROL SULFATE HFA 108 (90 BASE) MCG/ACT IN AERS: 2.0000 | INHALATION_SPRAY | Freq: Four times a day (QID) | RESPIRATORY_TRACT | 2 refills | Status: DC | PRN

## 2022-12-17 MED ORDER — DOXYCYCLINE HYCLATE 100 MG PO TABS: 100.0000 mg | ORAL_TABLET | Freq: Two times a day (BID) | ORAL | 0 refills | Status: AC

## 2022-12-17 NOTE — Progress Notes (Signed)
Subjective:  Patient ID: William [name], male    DOB: 23-Dec-1950, 72 y.o.   MRN: [id number]  Patient Care Team: Mechele Claude, MD as PCP - General (Family Medicine)   Chief Complaint:  Cough and Nasal Congestion (X 3 days )   HPI: William [name] is a 72 y.o. male presenting on 12/17/2022 for Cough and Nasal Congestion (X 3 days )   Cough This is a recurrent problem. The current episode started in the past 7 days. The problem has been gradually worsening. The cough is Productive of purulent sputum. Associated symptoms include nasal congestion, postnasal drip and rhinorrhea. Pertinent negatives include no chest pain, chills, ear congestion, ear pain, fever, headaches, sore throat, shortness of breath or wheezing. Nothing aggravates the symptoms. He has tried OTC cough suppressant for the symptoms. The treatment provided no relief. Sarcodosis   Cought and runny nose started 3 dys ago progressively more yellow sputum till this morning when coughing up green sputum/globules. Denys fever or pain. Pt states he had the same 1 year ago  that resolved with amoxicillin. Ptt recently treated for sinuitis with Amoxicillin. Denies COVID exposure. States he is current for COVID vaccines. Influenza vaccine last fall   Relevant past medical, surgical, family, and social history reviewed and updated as indicated.  Allergies and medications reviewed and updated. Data reviewed: Chart in Epic.   Past Medical History:  Diagnosis Date   Allergy    occ    GERD (gastroesophageal reflux disease)    past hx    Hyperlipidemia    Sarcoidosis 08/2013   Diagnosed with bronchoscopy. Pulmonary Involvement only.     Past Surgical History:  Procedure Laterality Date   APPENDECTOMY     CHOLECYSTECTOMY     COLONOSCOPY     LAPAROSCOPIC APPENDECTOMY N/A 04/06/2018   Procedure: APPENDECTOMY LAPAROSCOPIC;  Surgeon: Franky Macho, MD;  Location: [facility];  Service: General;  Laterality: N/A;   POLYPECTOMY      UPPER GASTROINTESTINAL ENDOSCOPY     VIDEO BRONCHOSCOPY Bilateral 09/14/2013   Procedure: VIDEO BRONCHOSCOPY WITH FLUORO;  Surgeon: Barbaraann Share, MD;  Location: [facility];  Service: Cardiopulmonary;  Laterality: Bilateral;    Social History   Socioeconomic History   Marital status: Married    Spouse name: Pat   Number of children: 0   Years of education: Not on file   Highest education level: Not on file  Occupational History   Occupation: Public affairs consultant: [employer]  Tobacco Use   Smoking status: Never   Smokeless tobacco: Never  Vaping Use   Vaping Use: Never used  Substance and Sexual Activity   Alcohol use: No   Drug use: No   Sexual activity: Not on file  Other Topics Concern   Not on file  Social History Narrative   Originally from [place]. Always lived in [place]. Previously traveled to [place]. No international travel. No pets currently. No bird, mold, or hot tub exposure. Currently works as an Secondary school teacher in Designer, fashion/clothing. 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    Married x 50 years in June 2023.   Social Determinants of Health   Financial Resource Strain: Low Risk  (05/13/2021)   Overall Financial Resource Strain (CARDIA)    Difficulty of Paying Living Expenses: Not hard at all  Food Insecurity: No Food Insecurity (05/13/2021)   Hunger Vital Sign    Worried About Running Out of Food in the  Last Year: Never true    Ran Out of Food in the Last Year: Never true  Transportation Needs: No Transportation Needs (05/13/2021)   PRAPARE - Administrator, Civil Service (Medical): No    Lack of Transportation (Non-Medical): No  Physical Activity: Sufficiently Active (05/13/2021)   Exercise Vital Sign    Days of Exercise per Week: 5 days    Minutes of Exercise per Session: 30 min  Stress: No Stress Concern Present (05/13/2021)   [employer] of Occupational Health - Occupational Stress Questionnaire    Feeling of Stress :  Not at all  Social Connections: Socially Integrated (05/13/2021)   Social Connection and Isolation Panel [NHANES]    Frequency of Communication with Friends and Family: More than three times a week    Frequency of Social Gatherings with Friends and Family: More than three times a week    Attends Religious Services: More than 4 times per year    Active Member of [employer] or Organizations: Yes    Attends Engineer, structural: More than 4 times per year    Marital Status: Married  Catering manager Violence: Not At Risk (05/13/2021)   Humiliation, Afraid, Rape, and Kick questionnaire    Fear of Current or Ex-Partner: No    Emotionally Abused: No    Physically Abused: No    Sexually Abused: No    Outpatient Encounter Medications as of 12/17/2022  Medication Sig   acetaminophen (TYLENOL) 325 MG tablet Take 325 mg by mouth as needed for moderate pain.   albuterol (VENTOLIN HFA) 108 (90 Base) MCG/ACT inhaler Inhale 2 puffs into the lungs every 6 (six) hours as needed for wheezing or shortness of breath.   B Complex Vitamins (B COMPLEX 1 PO) Take by mouth.   cholecalciferol (VITAMIN D) 400 units TABS tablet Take 400 Units by mouth daily.   Coenzyme Q10 (CO Q 10 PO) Take 100 mg by mouth daily.   doxycycline (VIBRA-TABS) 100 MG tablet Take 1 tablet (100 mg total) by mouth 2 (two) times daily for 10 days. 1 po bid   finasteride (PROSCAR) 5 MG tablet Take 5 mg by mouth daily.   fluticasone (FLONASE) 50 MCG/ACT nasal spray Place 2 sprays into both nostrils daily as needed for allergies or rhinitis.   meloxicam (MOBIC) 7.5 MG tablet Take 1 tablet (7.5 mg total) by mouth daily.   Multiple Vitamin (MULTIVITAMIN) tablet Take 1 tablet by mouth daily.   Omega-3 Fatty Acids (FISH OIL) 1000 MG CAPS Take by mouth.   predniSONE (DELTASONE) 20 MG tablet Take 2 tablets (40 mg total) by mouth daily with breakfast for 5 days. 2 po daily for 5 days   Facility-Administered Encounter Medications as of  12/17/2022  Medication   0.9 %  sodium chloride infusion    No Known Allergies  Review of Systems  Constitutional:  Positive for activity change. Negative for appetite change, chills, diaphoresis, fatigue, fever and unexpected weight change.  HENT:  Positive for postnasal drip and rhinorrhea. Negative for ear pain and sore throat.   Eyes: Negative.  Negative for photophobia and visual disturbance.  Respiratory:  Positive for cough. Negative for apnea, choking, chest tightness, shortness of breath, wheezing and stridor.   Cardiovascular: Negative.  Negative for chest pain, palpitations and leg swelling.  Gastrointestinal: Negative.   Endocrine: Negative.   Genitourinary: Negative.   Musculoskeletal: Negative.   Allergic/Immunologic: Negative.   Neurological: Negative.  Negative for dizziness, tremors, seizures, syncope, facial asymmetry,  speech difficulty, weakness, light-headedness, numbness and headaches.  Hematological: Negative.   Psychiatric/Behavioral:  Positive for sleep disturbance (congestion and cough). Negative for suicidal ideas.   All other systems reviewed and are negative.       Objective:  BP 130/82   Pulse 67   Temp 98.6 F (37 C) (Temporal)   Ht 5\' 7"  (1.702 m)   Wt 167 lb 12.8 oz (76.1 kg)   SpO2 94%   BMI 26.28 kg/m    Wt Readings from Last 3 Encounters:  12/17/22 167 lb 12.8 oz (76.1 kg)  07/31/22 167 lb 8 oz (76 kg)  07/04/22 169 lb 4 oz (76.8 kg)    Physical Exam Vitals and nursing note reviewed.  Constitutional:      Appearance: Normal appearance. He is normal weight.  HENT:     Head: Normocephalic and atraumatic.     Right Ear: A middle ear effusion is present. Tympanic membrane is not erythematous.     Left Ear: A middle ear effusion is present. Tympanic membrane is not erythematous.     Nose: Congestion and rhinorrhea present.     Mouth/Throat:     Mouth: Mucous membranes are moist.     Pharynx: Posterior oropharyngeal erythema present.  No pharyngeal swelling, oropharyngeal exudate or uvula swelling.     Tonsils: No tonsillar exudate or tonsillar abscesses.  Eyes:     Extraocular Movements: Extraocular movements intact.     Conjunctiva/sclera: Conjunctivae normal.     Pupils: Pupils are equal, round, and reactive to light.  Cardiovascular:     Rate and Rhythm: Normal rate and regular rhythm.     Pulses: Normal pulses.     Heart sounds: Normal heart sounds.  Pulmonary:     Effort: Pulmonary effort is normal.     Breath sounds: Normal breath sounds.     Comments: Diminished all fields Abdominal:     General: Bowel sounds are normal.  Genitourinary:    Penis: Normal.      Prostate: Normal.  Musculoskeletal:        General: Normal range of motion.     Cervical back: Normal range of motion.  Lymphadenopathy:     Cervical: Cervical adenopathy (submandibular palpable) present.  Skin:    General: Skin is warm.     Capillary Refill: Capillary refill takes less than 2 seconds.  Neurological:     General: No focal deficit present.     Mental Status: He is alert and oriented to person, place, and time. Mental status is at baseline.  Psychiatric:        Mood and Affect: Mood normal.        Behavior: Behavior normal.        Thought Content: Thought content normal.        Judgment: Judgment normal.     Results for orders placed or performed in visit on 07/04/21  Pulmonary function test  Result Value Ref Range   FVC-Pre 2.85 L   FVC-%Pred-Pre 65 %   FVC-Post 2.91 L   FVC-%Pred-Post 67 %   FVC-%Change-Post 2 %   FEV1-Pre 1.94 L   FEV1-%Pred-Pre 60 %   FEV1-Post 2.18 L   FEV1-%Pred-Post 67 %   FEV1-%Change-Post 12 %   FEV6-Pre 2.82 L   FEV6-%Pred-Pre 68 %   FEV6-Post 2.90 L   FEV6-%Pred-Post 70 %   FEV6-%Change-Post 2 %   Pre FEV1/FVC ratio 68 %   FEV1FVC-%Pred-Pre 91 %   Post FEV1/FVC ratio 75 %  FEV1FVC-%Change-Post 9 %   Pre FEV6/FVC Ratio 99 %   FEV6FVC-%Pred-Pre 104 %   Post FEV6/FVC ratio 100 %    FEV6FVC-%Pred-Post 105 %   FEV6FVC-%Change-Post 0 %   FEF 25-75 Pre 1.04 L/sec   FEF2575-%Pred-Pre 42 %   FEF 25-75 Post 1.66 L/sec   FEF2575-%Pred-Post 66 %   FEF2575-%Change-Post 58 %   RV 2.62 L   RV % pred 111 %   TLC 5.69 L   TLC % pred 83 %   DLCO unc 22.82 ml/min/mmHg   DLCO unc % pred 89 %   DLCO cor 22.82 ml/min/mmHg   DLCO cor % pred 89 %   DL/VA 1.61 ml/min/mmHg/L   DL/VA % pred 096 %       Pertinent labs & imaging results that were available during my care of the patient were reviewed by me and considered in my medical decision making.  Assessment & Plan:  William [name] was seen today for cough and nasal congestion.  Diagnoses and all orders for this visit:  Upper respiratory infection with cough and congestion -     doxycycline (VIBRA-TABS) 100 MG tablet; Take 1 tablet (100 mg total) by mouth 2 (two) times daily for 10 days. 1 po bid -     predniSONE (DELTASONE) 20 MG tablet; Take 2 tablets (40 mg total) by mouth daily with breakfast for 5 days. 2 po daily for 5 days -     albuterol (VENTOLIN HFA) 108 (90 Base) MCG/ACT inhaler; Inhale 2 puffs into the lungs every 6 (six) hours as needed for wheezing or shortness of breath.    -Continue OTC Mucinex 600mg  every 6-8 Hours -Increse your water intake with mucinex -Start Prednisone 40 MG daily for 5 days  -MDI Albuterol Inhaler as needed every 6 hours to improve breathing  -Start Doxycycline 100mg  BID for 10 days   Follow up plan: Return if symptoms worsen or fail to improve.  Make appointment for 4 weeks for follow up  Continue healthy lifestyle choices, including diet (rich in fruits, vegetables, and lean proteins, and low in salt and simple carbohydrates) and exercise (at least 30 minutes of moderate physical activity daily).  The above assessment and management plan was discussed with the patient. The patient verbalized understanding of and has agreed to the management plan. Patient is aware to call the clinic if  they develop any new symptoms or if symptoms persist or worsen. Patient is aware when to return to the clinic for a follow-up visit. Patient educated on when it is appropriate to go to the emergency department.   Maryelizabeth Kaufmann student RN [facility] [phone]   I personally was present during the history, physical exam, and medical decision-making activities of this visit and have verified that the services and findings are accurately documented in the nurse practitioner student's note.  Kari Baars, FNP-C [facility] [address] [place], [place] [address] [phone]

## 2022-12-17 NOTE — Patient Instructions (Signed)
-  Continue Mucinex 600mg  every 6-8 HoursOTC.  -Increse your water intake to make mucinex work better  -Start Prednisone 40 MG daily for 5 days  -MDI Albuterol Inhaler as needed every 6 hours to improve breathing  -Start Doxycycline 100mg  BID for 10 days

## 2022-12-17 NOTE — Telephone Encounter (Signed)
Spoke with the pt and informed him that yes, we do recommend the RSV vaccine.  Nothing further needed

## 2023-01-02 ENCOUNTER — Ambulatory Visit (AMBULATORY_SURGERY_CENTER): Payer: HMO

## 2023-01-02 VITALS — Ht 67.0 in | Wt 164.0 lb

## 2023-01-02 DIAGNOSIS — Z86010 Personal history of colonic polyps: Secondary | ICD-10-CM

## 2023-01-02 MED ORDER — PEG 3350-KCL-NA BICARB-NACL 420 G PO SOLR: 4000.0000 mL | Freq: Once | ORAL | 0 refills | Status: AC

## 2023-01-02 NOTE — Progress Notes (Signed)
No egg or soy allergy known to patient  No issues known to pt with past sedation with any surgeries or procedures Patient denies ever being told they had issues or difficulty with intubation  No FH of Malignant Hyperthermia Pt is not on diet pills Pt is not on  home 02  Pt is not on blood thinners  Pt denies issues with constipation  No A fib or A flutter Have any cardiac testing pending--no  LOA: independent  Prep: [place]    Patient's chart reviewed by William [name] CNRA prior to previsit and patient appropriate for the [facility].  Previsit completed and red dot placed by patient's name on their procedure day (on provider's schedule).     PV competed with patient. Prep instructions sent via mychart and home address.  

## 2023-01-20 ENCOUNTER — Encounter: Payer: PPO | Admitting: Internal Medicine

## 2023-01-22 ENCOUNTER — Telehealth: Payer: Self-pay | Admitting: Family Medicine

## 2023-01-22 ENCOUNTER — Encounter: Payer: Self-pay | Admitting: Family Medicine

## 2023-01-22 ENCOUNTER — Ambulatory Visit (INDEPENDENT_AMBULATORY_CARE_PROVIDER_SITE_OTHER): Payer: HMO | Admitting: Family Medicine

## 2023-01-22 VITALS — BP 101/52 | HR 53 | Temp 97.2°F | Ht 67.0 in | Wt 169.4 lb

## 2023-01-22 DIAGNOSIS — L409 Psoriasis, unspecified: Secondary | ICD-10-CM | POA: Diagnosis not present

## 2023-01-22 MED ORDER — MOMETASONE FUROATE 0.1 % EX CREA: TOPICAL_CREAM | CUTANEOUS | 5 refills | Status: DC

## 2023-01-22 NOTE — Progress Notes (Signed)
Subjective:  Patient ID: William [name], male    DOB: 1951-01-28  Age: 72 y.o. MRN: [id number]  CC: Follow-up   HPI DIESEL LINA presents for psoriasis on face. Follows nasolabial fold and central forehead. Gets red.      01/22/2023    8:09 AM 12/17/2022    8:04 AM 07/31/2022    2:33 PM  Depression screen PHQ 2/9  Decreased Interest 0 0 1  Down, Depressed, Hopeless 0 0 0  PHQ - 2 Score 0 0 1  Altered sleeping  0 0  Tired, decreased energy  0 2  Change in appetite  0 0  Feeling bad or failure about yourself   0 0  Trouble concentrating  0 0  Moving slowly or fidgety/restless  0 1  Suicidal thoughts  0 0  PHQ-9 Score  0 4  Difficult doing work/chores  Not difficult at all Not difficult at all    History Harsh has a past medical history of Allergy, GERD (gastroesophageal reflux disease), Hyperlipidemia, and Sarcoidosis (08/2013).   He has a past surgical history that includes Cholecystectomy; Video bronchoscopy (Bilateral, 09/14/2013); Colonoscopy; laparoscopic appendectomy (N/A, 04/06/2018); Appendectomy; Polypectomy; and Upper gastrointestinal endoscopy.   His family history includes Allergies in his father; Heart disease in his father.He reports that he has never smoked. He has never used smokeless tobacco. He reports that he does not drink alcohol and does not use drugs.    ROS Review of Systems  Objective:  BP (!) 101/52   Pulse (!) 53   Temp (!) 97.2 F (36.2 C)   Ht 5\' 7"  (1.702 m)   Wt 169 lb 6.4 oz (76.8 kg)   SpO2 98%   BMI 26.53 kg/m   BP Readings from Last 3 Encounters:  01/22/23 (!) 101/52  12/17/22 130/82  07/31/22 115/63    Wt Readings from Last 3 Encounters:  01/22/23 169 lb 6.4 oz (76.8 kg)  01/02/23 164 lb (74.4 kg)  12/17/22 167 lb 12.8 oz (76.1 kg)     Physical Exam Skin:    General: Skin is warm and dry.     Findings: Erythema (above nasal bridge and through nasolabial fols. Less on chessks) present.       Assessment & Plan:    Glenn was seen today for follow-up.  Diagnoses and all orders for this visit:  Psoriasis  Other orders -     mometasone (ELOCON) 0.1 % cream; Apply daily to facial outbreaks of psoriasis       I am having William [name] start on mometasone. I am also having [name] maintain his acetaminophen, cholecalciferol, multivitamin, Coenzyme Q10 (CO Q 10 PO), B Complex Vitamins (B COMPLEX 1 PO), Fish Oil, fluticasone, finasteride, meloxicam, and albuterol. We will continue to administer sodium chloride.  Allergies as of 01/22/2023   No Known Allergies      Medication List        Accurate as of January 22, 2023 11:59 PM. If you have any questions, ask your nurse or doctor.          acetaminophen 325 MG tablet Commonly known as: TYLENOL Take 325 mg by mouth as needed for moderate pain.   albuterol 108 (90 Base) MCG/ACT inhaler Commonly known as: VENTOLIN HFA Inhale 2 puffs into the lungs every 6 (six) hours as needed for wheezing or shortness of breath.   B COMPLEX 1 PO Take by mouth.   cholecalciferol 10 MCG (400 UNIT) Tabs tablet Commonly known  as: VITAMIN D3 Take 400 Units by mouth daily.   CO Q 10 PO Take 100 mg by mouth daily.   finasteride 5 MG tablet Commonly known as: PROSCAR Take 5 mg by mouth daily.   Fish Oil 1000 MG Caps Take by mouth.   fluticasone 50 MCG/ACT nasal spray Commonly known as: FLONASE Place 2 sprays into both nostrils daily as needed for allergies or rhinitis.   meloxicam 7.5 MG tablet Commonly known as: MOBIC Take 1 tablet (7.5 mg total) by mouth daily.   mometasone 0.1 % cream Commonly known as: ELOCON Apply daily to facial outbreaks of psoriasis Started by: Broadus John Denali Sharma   multivitamin tablet Take 1 tablet by mouth daily.         Follow-up: Return if symptoms worsen or fail to improve, for [facility]ete physical.  Mechele Claude, M.D.

## 2023-01-22 NOTE — Telephone Encounter (Signed)
IT is okay for face. That is its most used location. That is an error.

## 2023-01-22 NOTE — Telephone Encounter (Signed)
Pt made aware. He has no further concerns.

## 2023-01-23 ENCOUNTER — Encounter: Payer: Self-pay | Admitting: Internal Medicine

## 2023-01-25 ENCOUNTER — Encounter: Payer: Self-pay | Admitting: Family Medicine

## 2023-02-04 ENCOUNTER — Ambulatory Visit (AMBULATORY_SURGERY_CENTER): Payer: HMO | Admitting: Internal Medicine

## 2023-02-04 ENCOUNTER — Encounter: Payer: Self-pay | Admitting: Internal Medicine

## 2023-02-04 VITALS — BP 109/62 | HR 55 | Temp 98.7°F | Resp 13 | Ht 67.0 in

## 2023-02-04 DIAGNOSIS — D122 Benign neoplasm of ascending colon: Secondary | ICD-10-CM

## 2023-02-04 DIAGNOSIS — Z86010 Personal history of colonic polyps: Secondary | ICD-10-CM

## 2023-02-04 DIAGNOSIS — Z09 Encounter for follow-up examination after completed treatment for conditions other than malignant neoplasm: Secondary | ICD-10-CM | POA: Diagnosis not present

## 2023-02-04 DIAGNOSIS — D123 Benign neoplasm of transverse colon: Secondary | ICD-10-CM | POA: Diagnosis not present

## 2023-02-04 DIAGNOSIS — D124 Benign neoplasm of descending colon: Secondary | ICD-10-CM

## 2023-02-04 MED ORDER — SODIUM CHLORIDE 0.9 % IV SOLN: 500.0000 mL | Freq: Once | INTRAVENOUS | Status: DC

## 2023-02-04 NOTE — Op Note (Signed)
[facility] Patient Name: William [name] Procedure Date: 02/04/2023 1:53 PM MRN: [id number] Endoscopist: Beverley Fiedler , MD, [id number] Age: 72 Referring MD:  Date of Birth: 04/23/1951 Gender: Male Account #: [id number] Procedure:                Colonoscopy Indications:              High risk colon cancer surveillance: Personal                            history of multiple adenomas (including those > 1                            cm), Last colonoscopy: October 2021 (4 adenomas) Medicines:                Monitored Anesthesia Care Procedure:                Pre-Anesthesia Assessment:                           - Prior to the procedure, a History and Physical                            was performed, and patient medications and                            allergies were reviewed. The patient's tolerance of                            previous anesthesia was also reviewed. The risks                            and benefits of the procedure and the sedation                            options and risks were discussed with the patient.                            All questions were answered, and informed consent                            was obtained. Prior Anticoagulants: The patient has                            taken no anticoagulant or antiplatelet agents. ASA                            Grade Assessment: II - A patient with mild systemic                            disease. After reviewing the risks and benefits,                            the patient was deemed in satisfactory condition to  undergo the procedure.                           After obtaining informed consent, the colonoscope                            was passed under direct vision. Throughout the                            procedure, the patient's blood pressure, pulse, and                            oxygen saturations were monitored continuously. The                            CF [id number]  #[id number] was introduced through the anus                            and advanced to the cecum, identified by                            appendiceal orifice and ileocecal valve. The                            colonoscopy was performed without difficulty. The                            patient tolerated the procedure well. The quality                            of the bowel preparation was good. The ileocecal                            valve, appendiceal orifice, and rectum were                            photographed. Scope In: 2:08:18 PM Scope Out: 2:27:14 PM Scope Withdrawal Time: 0 hours 14 minutes 28 seconds  Total Procedure Duration: 0 hours 18 minutes 56 seconds  Findings:                 The digital rectal exam was normal.                           A 4 mm polyp was found in the ascending colon. The                            polyp was sessile. The polyp was removed with a                            cold snare. Resection and retrieval were complete.                           A 5 mm polyp was found in the splenic flexure. The  polyp was sessile. The polyp was removed with a                            cold snare. Resection and retrieval were complete.                           A 5 mm polyp was found in the descending colon. The                            polyp was sessile. The polyp was removed with a                            cold snare. Resection and retrieval were complete.                           A few small-mouthed diverticula were found in the                            ascending colon.                           The retroflexed view of the distal rectum and anal                            verge was normal and showed no anal or rectal                            abnormalities. Complications:            No immediate complications. Estimated Blood Loss:     Estimated blood loss was minimal. Impression:               - One 4 mm polyp in the ascending  colon, removed                            with a cold snare. Resected and retrieved.                           - One 5 mm polyp at the splenic flexure, removed                            with a cold snare. Resected and retrieved.                           - One 5 mm polyp in the descending colon, removed                            with a cold snare. Resected and retrieved.                           - Diverticulosis in the ascending colon.                           - The distal  rectum and anal verge are normal on                            retroflexion view. Recommendation:           - Patient has a contact number available for                            emergencies. The signs and symptoms of potential                            delayed complications were discussed with the                            patient. Return to normal activities tomorrow.                            Written discharge instructions were provided to the                            patient.                           - Resume previous diet.                           - Continue present medications.                           - Await pathology results.                           - Repeat colonoscopy is recommended for                            surveillance. The colonoscopy date will be                            determined after pathology results from today's                            exam become available for review. Beverley Fiedler, MD 02/04/2023 2:40:58 PM This report has been signed electronically.

## 2023-02-04 NOTE — Progress Notes (Signed)
GASTROENTEROLOGY PROCEDURE H&P NOTE   Primary Care Physician: Mechele Claude, MD    Reason for Procedure:  Personal history of adenomatous colon polyps  Plan:    Colonoscopy  Patient is appropriate for endoscopic procedure(s) in the ambulatory ([facility]) setting.  The nature of the procedure, as well as the risks, benefits, and alternatives were carefully and thoroughly reviewed with the patient. Ample time for discussion and questions allowed. The patient understood, was satisfied, and agreed to proceed.     HPI: William [name] is a 72 y.o. male who presents for surveillance colonoscopy.  Medical history as below.  Tolerated the prep.  No recent chest pain or shortness of breath.  No abdominal pain today.  Past Medical History:  Diagnosis Date   Allergy    occ    GERD (gastroesophageal reflux disease)    past hx    Hyperlipidemia    Sarcoidosis 08/2013   Diagnosed with bronchoscopy. Pulmonary Involvement only.     Past Surgical History:  Procedure Laterality Date   APPENDECTOMY     CHOLECYSTECTOMY     COLONOSCOPY     LAPAROSCOPIC APPENDECTOMY N/A 04/06/2018   Procedure: APPENDECTOMY LAPAROSCOPIC;  Surgeon: Franky Macho, MD;  Location: [facility];  Service: General;  Laterality: N/A;   POLYPECTOMY     UPPER GASTROINTESTINAL ENDOSCOPY     VIDEO BRONCHOSCOPY Bilateral 09/14/2013   Procedure: VIDEO BRONCHOSCOPY WITH FLUORO;  Surgeon: Barbaraann Share, MD;  Location: [facility];  Service: Cardiopulmonary;  Laterality: Bilateral;    Prior to Admission medications   Medication Sig Start Date End Date Taking? Authorizing Provider  acetaminophen (TYLENOL) 325 MG tablet Take 325 mg by mouth as needed for moderate pain.   Yes [provider]  albuterol (VENTOLIN HFA) 108 (90 Base) MCG/ACT inhaler Inhale 2 puffs into the lungs every 6 (six) hours as needed for wheezing or shortness of breath. 12/17/22  Yes Rakes, Doralee Albino, FNP  B Complex Vitamins (B COMPLEX 1 PO) Take by mouth.     [provider]  cholecalciferol (VITAMIN D) 400 units TABS tablet Take 400 Units by mouth daily.    [provider]  Coenzyme Q10 (CO Q 10 PO) Take 100 mg by mouth daily.    [provider]  finasteride (PROSCAR) 5 MG tablet Take 5 mg by mouth daily. 12/26/21   [provider]  fluticasone (FLONASE) 50 MCG/ACT nasal spray Place 2 sprays into both nostrils daily as needed for allergies or rhinitis. 01/07/22   Oretha Milch, MD  meloxicam (MOBIC) 7.5 MG tablet Take 1 tablet (7.5 mg total) by mouth daily. 07/04/22   Gabriel Earing, FNP  mometasone (ELOCON) 0.1 % cream Apply daily to facial outbreaks of psoriasis 01/22/23   Mechele Claude, MD  Multiple Vitamin (MULTIVITAMIN) tablet Take 1 tablet by mouth daily.    [provider]  Omega-3 Fatty Acids (FISH OIL) 1000 MG CAPS Take by mouth.    [provider]    Current Outpatient Medications  Medication Sig Dispense Refill   acetaminophen (TYLENOL) 325 MG tablet Take 325 mg by mouth as needed for moderate pain.     albuterol (VENTOLIN HFA) 108 (90 Base) MCG/ACT inhaler Inhale 2 puffs into the lungs every 6 (six) hours as needed for wheezing or shortness of breath. 8 g 2   B Complex Vitamins (B COMPLEX 1 PO) Take by mouth.     cholecalciferol (VITAMIN D) 400 units TABS tablet Take 400 Units by mouth  daily.     Coenzyme Q10 (CO Q 10 PO) Take 100 mg by mouth daily.     finasteride (PROSCAR) 5 MG tablet Take 5 mg by mouth daily.     fluticasone (FLONASE) 50 MCG/ACT nasal spray Place 2 sprays into both nostrils daily as needed for allergies or rhinitis. 15.8 mL 3   meloxicam (MOBIC) 7.5 MG tablet Take 1 tablet (7.5 mg total) by mouth daily. 15 tablet 0   mometasone (ELOCON) 0.1 % cream Apply daily to facial outbreaks of psoriasis 45 g 5   Multiple Vitamin (MULTIVITAMIN) tablet Take 1 tablet by mouth daily.     Omega-3 Fatty Acids (FISH OIL) 1000 MG CAPS Take by mouth.     Current  Facility-Administered Medications  Medication Dose Route Frequency Provider Last Rate Last Admin   0.9 %  sodium chloride infusion  500 mL Intravenous Once Lillie Bollig, Carie Caddy, MD       0.9 %  sodium chloride infusion  500 mL Intravenous Once Sherrod Toothman, Carie Caddy, MD        Allergies as of 02/04/2023   (No Known Allergies)    Family History  Problem Relation Age of Onset   Allergies Father    Heart disease Father    Lung disease Neg Hx    Rheumatologic disease Neg Hx    Colon cancer Neg Hx    Colon polyps Neg Hx    Esophageal cancer Neg Hx    Stomach cancer Neg Hx    Rectal cancer Neg Hx     Social History   Socioeconomic History   Marital status: Married    Spouse name: Garment/textile technologist   Number of children: 0   Years of education: Not on file   Highest education level: Associate degree: occupational, Scientist, product/process development, or vocational program  Occupational History   Occupation: Public affairs consultant: [employer]  Tobacco Use   Smoking status: Never   Smokeless tobacco: Never  Vaping Use   Vaping status: Never Used  Substance and Sexual Activity   Alcohol use: No   Drug use: No   Sexual activity: Not on file  Other Topics Concern   Not on file  Social History Narrative   Originally from [place]. Always lived in [place]. Previously traveled to [place]. No international travel. No pets currently. No bird, mold, or hot tub exposure. Currently works as an Secondary school teacher in Designer, fashion/clothing. 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    Married x 50 years in June 2023.   Social Determinants of Health   Financial Resource Strain: Low Risk  (01/21/2023)   Overall Financial Resource Strain (CARDIA)    Difficulty of Paying Living Expenses: Not hard at all  Food Insecurity: Unknown (01/21/2023)   Hunger Vital Sign    Worried About Running Out of Food in the Last Year: Not on file    Ran Out of Food in the Last Year: Never true  Transportation Needs: No Transportation Needs (01/21/2023)   PRAPARE  - Administrator, Civil Service (Medical): No    Lack of Transportation (Non-Medical): No  Physical Activity: Insufficiently Active (01/21/2023)   Exercise Vital Sign    Days of Exercise per Week: 3 days    Minutes of Exercise per Session: 40 min  Stress: No Stress Concern Present (01/21/2023)   [employer] of Occupational Health - Occupational Stress Questionnaire    Feeling of Stress : Not at all  Social Connections:  Moderately Integrated (01/21/2023)   Social Connection and Isolation Panel [NHANES]    Frequency of Communication with Friends and Family: Three times a week    Frequency of Social Gatherings with Friends and Family: Twice a week    Attends Religious Services: More than 4 times per year    Active Member of [employer] or Organizations: No    Attends Engineer, structural: Not on file    Marital Status: Married  Catering manager Violence: Not At Risk (05/13/2021)   Humiliation, Afraid, Rape, and Kick questionnaire    Fear of Current or Ex-Partner: No    Emotionally Abused: No    Physically Abused: No    Sexually Abused: No    Physical Exam: Vital signs in last 24 hours: @BP  (!) 129/93   Pulse 68   Temp 98.7 F (37.1 C)   Ht 5\' 7"  (1.702 m)   SpO2 98%   BMI 26.53 kg/m  GEN: NAD EYE: Sclerae anicteric ENT: MMM CV: Non-tachycardic Pulm: CTA b/l GI: Soft, NT/ND NEURO:  Alert & Oriented x 3   Erick Blinks, MD [facility]  02/04/2023 2:01 PM

## 2023-02-04 NOTE — Progress Notes (Signed)
Uneventful anesthetic. Report to pacu rn. Vss. Care resumed by rn. 

## 2023-02-04 NOTE — Patient Instructions (Addendum)
Continue present medications. Await pathology results.                         Please read over handouts about polyps and diverticulosis  YOU HAD AN ENDOSCOPIC PROCEDURE TODAY AT THE [facility]:   Refer to the procedure report that was given to you for any specific questions about what was found during the examination.  If the procedure report does not answer your questions, please call your gastroenterologist to clarify.  If you requested that your care partner not be given the details of your procedure findings, then the procedure report has been included in a sealed envelope for you to review at your convenience later.  YOU SHOULD EXPECT: Some feelings of bloating in the abdomen. Passage of more gas than usual.  Walking can help get rid of the air that was put into your GI tract during the procedure and reduce the bloating. If you had a lower endoscopy (such as a colonoscopy or flexible sigmoidoscopy) you may notice spotting of blood in your stool or on the toilet paper. If you underwent a bowel prep for your procedure, you may not have a normal bowel movement for a few days.  Please Note:  You might notice some irritation and congestion in your nose or some drainage.  This is from the oxygen used during your procedure.  There is no need for concern and it should clear up in a day or so.  SYMPTOMS TO REPORT IMMEDIATELY:  Following lower endoscopy (colonoscopy or flexible sigmoidoscopy):  Excessive amounts of blood in the stool  Significant tenderness or worsening of abdominal pains  Swelling of the abdomen that is new, acute  Fever of 100F or higher  For urgent or emergent issues, a gastroenterologist can be reached at any hour by calling [phone]. Do not use MyChart messaging for urgent concerns.    DIET:  We do recommend a small meal at first, but then you may proceed to your regular diet.  Drink plenty of fluids but you should avoid alcoholic beverages for 24  hours.  ACTIVITY:  You should plan to take it easy for the rest of today and you should NOT DRIVE or use heavy machinery until tomorrow (because of the sedation medicines used during the test).    FOLLOW UP: Our staff will call the number listed on your records the next business day following your procedure.  We will call around 7:15- 8:00 am to check on you and address any questions or concerns that you may have regarding the information given to you following your procedure. If we do not reach you, we will leave a message.     If any biopsies were taken you will be contacted by phone or by letter within the next 1-3 weeks.  Please call us at [phone] if you have not heard about the biopsies in 3 weeks.    SIGNATURES/CONFIDENTIALITY: You and/or your care partner have signed paperwork which will be entered into your electronic medical record.  These signatures attest to the fact that that the information above on your After Visit Summary has been reviewed and is understood.  Full responsibility of the confidentiality of this discharge information lies with you and/or your care-partner.

## 2023-02-04 NOTE — Progress Notes (Signed)
Called to room to assist during endoscopic procedure.  Patient ID and intended procedure confirmed with present staff. Received instructions for my participation in the procedure from the performing physician.  

## 2023-02-04 NOTE — Progress Notes (Signed)
Pt's states no medical or surgical changes since previsit or office visit. 

## 2023-02-05 ENCOUNTER — Telehealth: Payer: Self-pay

## 2023-02-05 NOTE — Telephone Encounter (Signed)
  Follow up Call-     02/04/2023    1:18 PM  Call back number  Post procedure Call Back phone  # [phone]  Permission to leave phone message Yes     Patient questions:  Do you have a fever, pain , or abdominal swelling? No. Pain Score  0 *  Have you tolerated food without any problems? Yes.    Have you been able to return to your normal activities? Yes.    Do you have any questions about your discharge instructions: Diet   No. Medications  No. Follow up visit  No.  Do you have questions or concerns about your Care? No.  Actions: * If pain score is 4 or above: No action needed, pain <4.

## 2023-02-09 ENCOUNTER — Encounter: Payer: Self-pay | Admitting: Internal Medicine

## 2023-03-11 ENCOUNTER — Ambulatory Visit (INDEPENDENT_AMBULATORY_CARE_PROVIDER_SITE_OTHER): Payer: HMO | Admitting: Family Medicine

## 2023-03-11 ENCOUNTER — Encounter: Payer: Self-pay | Admitting: Family Medicine

## 2023-03-11 VITALS — BP 113/68 | HR 59 | Temp 97.3°F | Ht 67.0 in | Wt 164.6 lb

## 2023-03-11 DIAGNOSIS — M25561 Pain in right knee: Secondary | ICD-10-CM

## 2023-03-11 DIAGNOSIS — Z0000 Encounter for general adult medical examination without abnormal findings: Secondary | ICD-10-CM

## 2023-03-11 DIAGNOSIS — Z0001 Encounter for general adult medical examination with abnormal findings: Secondary | ICD-10-CM

## 2023-03-11 DIAGNOSIS — Z23 Encounter for immunization: Secondary | ICD-10-CM

## 2023-03-11 DIAGNOSIS — Z13220 Encounter for screening for lipoid disorders: Secondary | ICD-10-CM | POA: Diagnosis not present

## 2023-03-11 DIAGNOSIS — Z1321 Encounter for screening for nutritional disorder: Secondary | ICD-10-CM

## 2023-03-11 DIAGNOSIS — M25461 Effusion, right knee: Secondary | ICD-10-CM | POA: Diagnosis not present

## 2023-03-11 DIAGNOSIS — Z125 Encounter for screening for malignant neoplasm of prostate: Secondary | ICD-10-CM | POA: Diagnosis not present

## 2023-03-11 DIAGNOSIS — N400 Enlarged prostate without lower urinary tract symptoms: Secondary | ICD-10-CM | POA: Diagnosis not present

## 2023-03-11 LAB — URINALYSIS
Bilirubin, UA: NEGATIVE
Glucose, UA: NEGATIVE
Ketones, UA: NEGATIVE
Leukocytes,UA: NEGATIVE
Nitrite, UA: NEGATIVE
Protein,UA: NEGATIVE
RBC, UA: NEGATIVE
Specific Gravity, UA: 1.025 (ref 1.005–1.030)
Urobilinogen, Ur: 0.2 mg/dL (ref 0.2–1.0)
pH, UA: 5.5 (ref 5.0–7.5)

## 2023-03-11 MED ORDER — FINASTERIDE 5 MG PO TABS: 5.0000 mg | ORAL_TABLET | Freq: Every day | ORAL | 3 refills | Status: AC

## 2023-03-11 MED ORDER — MELOXICAM 7.5 MG PO TABS
7.5000 mg | ORAL_TABLET | Freq: Every day | ORAL | 3 refills | Status: DC
Start: 2023-03-11 — End: 2024-04-27

## 2023-03-11 NOTE — Addendum Note (Signed)
Addended by: Adella Hare B on: 03/11/2023 04:42 PM   Modules accepted: Orders

## 2023-03-11 NOTE — Progress Notes (Signed)
Subjective:  Patient ID: William William [name], male    DOB: 1951-05-11  Age: 72 y.o. MRN: [id number]  CC: Annual Exam   HPI William William [name] presents for Annual exam.  Continues to use meloxicam for knees. Works well. If he misses it some other joints will flare too.      03/11/2023    8:15 AM 01/22/2023    8:09 AM 12/17/2022    8:04 AM  Depression screen PHQ 2/9  Decreased Interest 0 0 0  Down, Depressed, Hopeless 0 0 0  PHQ - 2 Score 0 0 0  Altered sleeping   0  Tired, decreased energy   0  Change in appetite   0  Feeling bad or failure about yourself    0  Trouble concentrating   0  Moving slowly or fidgety/restless   0  Suicidal thoughts   0  PHQ-9 Score   0  Difficult doing work/chores   Not difficult at all    History William William [name] has a past medical history of Allergy, GERD (gastroesophageal reflux disease), Hyperlipidemia, and Sarcoidosis (08/2013).   He has a past surgical history that includes Cholecystectomy; Video bronchoscopy (Bilateral, 09/14/2013); Colonoscopy; laparoscopic appendectomy (N/A, 04/06/2018); Appendectomy; Polypectomy; and Upper gastrointestinal endoscopy.   His family history includes Allergies in his father; Heart disease in his father.He reports that he has never smoked. He has never used smokeless tobacco. He reports that he does not drink alcohol and does not use drugs.    ROS Review of Systems  Constitutional:  Negative for activity change, fatigue and unexpected weight change.  HENT:  Negative for congestion, ear pain, hearing loss, postnasal drip and trouble swallowing.   Eyes:  Negative for pain and visual disturbance.  Respiratory:  Negative for cough, chest tightness and shortness of breath.   Cardiovascular:  Negative for chest pain, palpitations and leg swelling.  Gastrointestinal:  Negative for abdominal distention, abdominal pain, blood in stool, constipation, diarrhea, nausea and vomiting.  Endocrine: Negative for cold intolerance, heat  intolerance and polydipsia.  Genitourinary:  Negative for difficulty urinating, dysuria, flank pain, frequency and urgency.  Musculoskeletal:  Negative for arthralgias and joint swelling.  Skin:  Negative for color change, rash and wound.  Neurological:  Negative for dizziness, syncope, speech difficulty, weakness, light-headedness, numbness and headaches.  Hematological:  Does not bruise/bleed easily.  Psychiatric/Behavioral:  Negative for confusion, decreased concentration, dysphoric mood and sleep disturbance. The patient is not nervous/anxious.     Objective:  BP 113/68   Pulse (!) 59   Temp (!) 97.3 F (36.3 C)   Ht 5\' 7"  (1.702 m)   Wt 164 lb 9.6 oz (74.7 kg)   SpO2 99%   BMI 25.78 kg/m   BP Readings from Last 3 Encounters:  03/11/23 113/68  02/04/23 109/62  01/22/23 (!) 101/52    Wt Readings from Last 3 Encounters:  03/11/23 164 lb 9.6 oz (74.7 kg)  01/22/23 169 lb 6.4 oz (76.8 kg)  01/02/23 164 lb (74.4 kg)     Physical Exam Constitutional:      Appearance: He is well-developed.  HENT:     Head: Normocephalic and atraumatic.  Eyes:     Pupils: Pupils are equal, round, and reactive to light.  Neck:     Thyroid: No thyromegaly.     Trachea: No tracheal deviation.  Cardiovascular:     Rate and Rhythm: Normal rate and regular rhythm.     Heart sounds: Normal heart sounds. No murmur  heard.    No friction rub. No gallop.  Pulmonary:     Breath sounds: Normal breath sounds. No wheezing or rales.  Abdominal:     General: Bowel sounds are normal. There is no distension.     Palpations: Abdomen is soft. There is no mass.     Tenderness: There is no abdominal tenderness.     Hernia: There is no hernia in the left inguinal area.  Genitourinary:    Penis: Normal.      Testes: Normal.  Musculoskeletal:        General: Normal range of motion.     Cervical back: Normal range of motion.  Lymphadenopathy:     Cervical: No cervical adenopathy.  Skin:    General:  Skin is warm and dry.  Neurological:     Mental Status: He is alert and oriented to person, place, and time.       Assessment & Plan:   William William [name] was seen today for annual exam.  Diagnoses and all orders for this visit:  Well adult exam -     CBC with Differential/Platelet -     CMP14+EGFR -     Lipid panel -     VITAMIN D 25 Hydroxy (Vit-D Deficiency, Fractures) -     Urinalysis  Lipid screening -     Lipid panel  Prostate cancer screening -     PSA, total and free  Encounter for vitamin deficiency screening -     VITAMIN D 25 Hydroxy (Vit-D Deficiency, Fractures)  Pain and swelling of right knee -     meloxicam (MOBIC) 7.5 MG tablet; Take 1 tablet (7.5 mg total) by mouth daily.  Benign prostatic hyperplasia without lower urinary tract symptoms -     PSA, total and free -     finasteride (PROSCAR) 5 MG tablet; Take 1 tablet (5 mg total) by mouth daily.       I have changed William William [name]'s finasteride. I am also having William [name] maintain his acetaminophen, cholecalciferol, multivitamin, Coenzyme Q10 (CO Q 10 PO), B Complex Vitamins (B COMPLEX 1 PO), Fish Oil, fluticasone, albuterol, mometasone, and meloxicam.  Allergies as of 03/11/2023   No Known Allergies      Medication List        Accurate as of March 11, 2023  8:53 AM. If you have any questions, ask your nurse or doctor.          acetaminophen 325 MG tablet Commonly known as: TYLENOL Take 325 mg by mouth as needed for moderate pain.   albuterol 108 (90 Base) MCG/ACT inhaler Commonly known as: VENTOLIN HFA Inhale 2 puffs into the lungs every 6 (six) hours as needed for wheezing or shortness of breath.   B COMPLEX 1 PO Take by mouth.   cholecalciferol 10 MCG (400 UNIT) Tabs tablet Commonly known as: VITAMIN D3 Take 400 Units by mouth daily.   CO Q 10 PO Take 100 mg by mouth daily.   finasteride 5 MG tablet Commonly known as: PROSCAR Take 1 tablet (5 mg total) by mouth daily.   Fish Oil  1000 MG Caps Take by mouth.   fluticasone 50 MCG/ACT nasal spray Commonly known as: FLONASE Place 2 sprays into both nostrils daily as needed for allergies or rhinitis.   meloxicam 7.5 MG tablet Commonly known as: MOBIC Take 1 tablet (7.5 mg total) by mouth daily.   mometasone 0.1 % cream Commonly known as: ELOCON Apply daily to facial outbreaks of  psoriasis   multivitamin tablet Take 1 tablet by mouth daily.         Follow-up: Return in about 1 year (around 03/10/2024).  Mechele Claude, M.D.

## 2023-03-12 LAB — CMP14+EGFR
ALT: 17 IU/L (ref 0–44)
AST: 21 IU/L (ref 0–40)
Albumin: 4.6 g/dL (ref 3.9–4.9)
Alkaline Phosphatase: 82 IU/L (ref 44–121)
BUN/Creatinine Ratio: 25 — ABNORMAL HIGH (ref 10–24)
BUN: 22 mg/dL (ref 8–27)
Bilirubin Total: 0.5 mg/dL (ref 0.0–1.2)
CO2: 24 mmol/L (ref 20–29)
Calcium: 9.3 mg/dL (ref 8.6–10.2)
Chloride: 102 mmol/L (ref 96–106)
Creatinine, Ser: 0.87 mg/dL (ref 0.76–1.27)
Globulin, Total: 3 g/dL (ref 1.5–4.5)
Glucose: 99 mg/dL (ref 70–99)
Potassium: 4.1 mmol/L (ref 3.5–5.2)
Sodium: 143 mmol/L (ref 134–144)
Total Protein: 7.6 g/dL (ref 6.0–8.5)
eGFR: 93 mL/min/{1.73_m2} (ref >59)

## 2023-03-12 LAB — CBC WITH DIFFERENTIAL/PLATELET
Basophils Absolute: 0 10*3/uL (ref 0.0–0.2)
Basos: 1 %
EOS (ABSOLUTE): 0.2 10*3/uL (ref 0.0–0.4)
Eos: 5 %
Hematocrit: 47.5 % (ref 37.5–51.0)
Hemoglobin: 15.8 g/dL (ref 13.0–17.7)
Immature Grans (Abs): 0 10*3/uL (ref 0.0–0.1)
Immature Granulocytes: 0 %
Lymphocytes Absolute: 0.6 10*3/uL — ABNORMAL LOW (ref 0.7–3.1)
Lymphs: 14 %
MCH: 31.2 pg (ref 26.6–33.0)
MCHC: 33.3 g/dL (ref 31.5–35.7)
MCV: 94 fL (ref 79–97)
Monocytes Absolute: 0.5 10*3/uL (ref 0.1–0.9)
Monocytes: 11 %
Neutrophils Absolute: 2.9 10*3/uL (ref 1.4–7.0)
Neutrophils: 69 %
Platelets: 188 10*3/uL (ref 150–450)
RBC: 5.06 x10E6/uL (ref 4.14–5.80)
RDW: 12.1 % (ref 11.6–15.4)
WBC: 4.2 10*3/uL (ref 3.4–10.8)

## 2023-03-12 LAB — LIPID PANEL
Chol/HDL Ratio: 5.5 ratio — ABNORMAL HIGH (ref 0.0–5.0)
Cholesterol, Total: 219 mg/dL — ABNORMAL HIGH (ref 100–199)
HDL: 40 mg/dL (ref >39)
LDL Chol Calc (NIH): 160 mg/dL — ABNORMAL HIGH (ref 0–99)
Triglycerides: 107 mg/dL (ref 0–149)
VLDL Cholesterol Cal: 19 mg/dL (ref 5–40)

## 2023-03-12 LAB — PSA, TOTAL AND FREE
PSA, Free Pct: 15.8 %
PSA, Free: 1.6 ng/mL
Prostate Specific Ag, Serum: 10.1 ng/mL — ABNORMAL HIGH (ref 0.0–4.0)

## 2023-03-12 LAB — VITAMIN D 25 HYDROXY (VIT D DEFICIENCY, FRACTURES): Vit D, 25-Hydroxy: 46.1 ng/mL (ref 30.0–100.0)

## 2023-03-13 ENCOUNTER — Other Ambulatory Visit: Payer: Self-pay | Admitting: Family Medicine

## 2023-03-13 ENCOUNTER — Encounter: Payer: Self-pay | Admitting: Family Medicine

## 2023-03-13 DIAGNOSIS — R972 Elevated prostate specific antigen [PSA]: Secondary | ICD-10-CM

## 2023-05-18 DIAGNOSIS — R972 Elevated prostate specific antigen [PSA]: Secondary | ICD-10-CM | POA: Diagnosis not present

## 2023-05-25 DIAGNOSIS — R972 Elevated prostate specific antigen [PSA]: Secondary | ICD-10-CM | POA: Diagnosis not present

## 2023-05-25 DIAGNOSIS — N401 Enlarged prostate with lower urinary tract symptoms: Secondary | ICD-10-CM | POA: Diagnosis not present

## 2023-05-25 DIAGNOSIS — R351 Nocturia: Secondary | ICD-10-CM | POA: Diagnosis not present

## 2023-05-25 DIAGNOSIS — N200 Calculus of kidney: Secondary | ICD-10-CM | POA: Diagnosis not present

## 2023-08-10 ENCOUNTER — Telehealth: Payer: Self-pay | Admitting: Pulmonary Disease

## 2023-08-10 NOTE — Telephone Encounter (Signed)
 PT needs Flonase  refill [facility] in [place]

## 2023-08-10 NOTE — Telephone Encounter (Signed)
 ATC X1. Lmtcb. Pt has not been seen since 01-06-2022. Pt needs an appointment before any refills are given.

## 2023-08-11 MED ORDER — FLUTICASONE PROPIONATE 50 MCG/ACT NA SUSP: 2.0000 | Freq: Every day | NASAL | 0 refills | Status: DC | PRN

## 2023-08-11 NOTE — Telephone Encounter (Signed)
I refilled the flonase x 1 only I called the pt and left detailed msg it was refilled and needs to keep pending appt  Nothing further needed

## 2023-08-11 NOTE — Telephone Encounter (Signed)
Patient scheduled 09/08/2023 with Dr. Vassie Loll. Patient out of the medication. Patient phone number is [phone].

## 2023-09-08 ENCOUNTER — Encounter (HOSPITAL_BASED_OUTPATIENT_CLINIC_OR_DEPARTMENT_OTHER): Payer: Self-pay | Admitting: Pulmonary Disease

## 2023-09-08 ENCOUNTER — Ambulatory Visit (HOSPITAL_BASED_OUTPATIENT_CLINIC_OR_DEPARTMENT_OTHER)

## 2023-09-08 ENCOUNTER — Ambulatory Visit (HOSPITAL_BASED_OUTPATIENT_CLINIC_OR_DEPARTMENT_OTHER): Payer: HMO | Admitting: Pulmonary Disease

## 2023-09-08 VITALS — BP 110/72 | HR 67 | Ht 67.0 in | Wt 164.4 lb

## 2023-09-08 DIAGNOSIS — J439 Emphysema, unspecified: Secondary | ICD-10-CM | POA: Diagnosis not present

## 2023-09-08 DIAGNOSIS — D860 Sarcoidosis of lung: Secondary | ICD-10-CM | POA: Diagnosis not present

## 2023-09-08 DIAGNOSIS — D869 Sarcoidosis, unspecified: Secondary | ICD-10-CM | POA: Diagnosis not present

## 2023-09-08 MED ORDER — FLUTICASONE PROPIONATE 50 MCG/ACT NA SUSP: 2.0000 | Freq: Every day | NASAL | 11 refills | Status: AC | PRN

## 2023-09-08 NOTE — Progress Notes (Signed)
 Subjective:    Patient ID: William [name], male    DOB: [birth year], 73 y.o.   MRN: [id number]  HPI  73 yo never smoker with sarcoidosis  for annual follow-up. He was diagnosed in 2015 when he presented with masslike consolidation in the left upper lobe and hilar lymphadenopathy and underwent bronchoscopic biopsy.   Since then, left upper lobe has resolved to minimal scarring.   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 He has a history of sarcoidosis, which has been dormant for several years, and prostate issues. He is currently on Proscar for his prostate issues and Flonase for an unspecified condition. He was concerned about the potential impact of Flonase on his prostate, but  reassured [name] that it should not have any effect. He also has a prescription for albuterol, but he reports not needing to use it often as his breathing has been stable. 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 He also mentions having psoriasis.    Significant tests/ events reviewed   PFT 06/2021 unchanged, FVC 65%, FEV1 60%, ratio 68, TLC 83%, DLCO 89%   10/2017 PFT >> ratio 76, FEV1 65%, FVC 64%, TLC 83%, DLCO 76%   11/14/16: FVC 2.98 L (66%) FEV1 2.07 L (62%) FEV1/FVC 0.70  negative bronchodilator response TLC 5.20 L (76%) RV 92% ERV 116% DLCO corrected 72%   11/07/15: FVC 3.22 L (71%) FEV1 2.28 L (67%) FEV1/FVC 0.71  negative bronchodilator response TLC 4.27 L (62%) RV 22% ERV 215% DLCO corrected 84%     CT CHEST W/O 01/04/14: No pleural effusion or thickening. Reduce size of left upper lobe consolidation/mass. Diffuse peri-bronchovascular nodularity. Right hilar lymph node measuring approximately 2 cm. No other nodule or mass appreciated. No pericardial effusion. Patulous esophagus.     PATHOLOGY BRUSH LUL (09/14/13):  Reactive bronchial epithelial cells with fragments of granuloma. No atypia or malignancy. BAL LUL (09/14/13): Rare atypical cells. Abundant histiocytes. Acute and chronic inflammation. FNA & TBBx LUL (09/14/13): Chronic nonnecrotizing granulomatous inflammation with granulomata. AFB, GMS, & PAS stains negative. No atypia or malignancy  Review of Systems neg for any significant sore throat, dysphagia, itching, sneezing, nasal congestion or excess/ purulent secretions, fever, chills, sweats, unintended wt loss, pleuritic or exertional cp, hempoptysis, orthopnea pnd or change in chronic leg swelling. Also denies presyncope, palpitations, heartburn, abdominal pain, nausea, vomiting, diarrhea or change in bowel or urinary habits, dysuria,hematuria, rash, arthralgias, visual complaints, headache, numbness weakness or ataxia.     Objective:   Physical Exam  Gen. Pleasant, well-nourished, in no distress ENT - no thrush, no pallor/icterus,no post nasal drip Neck: No JVD, no thyromegaly, no carotid bruits Lungs: no use of accessory muscles, no dullness to percussion, clear without rales or rhonchi  Cardiovascular: Rhythm regular, heart sounds  normal, no murmurs or gallops, no peripheral edema Musculoskeletal: No deformities, no cyanosis or clubbing        Assessment & Plan:    Sarcoidosis Sarcoidosis has been dormant for several years with previous imaging showing scarring. He is asymptomatic with no respiratory symptoms. The condition appears well-managed without recent exacerbations. - Order chest x-ray to assess current status of sarcoidosis - Review x-ray results in 2-3 days - Consider releasing [name] from regular follow-up if x-ray is stable   Benign Prostatic Hyperplasia (BPH) He is on  Proscar for BPH. There is concern about potential effects of Flonase on the prostate, but it was clarified that Flonase should not affect the prostate. He is not experiencing new or worsening symptoms related  to BPH. - Refill Flonase prescription

## 2023-09-08 NOTE — Patient Instructions (Addendum)
 CXR today  X refill on flonase

## 2023-09-12 ENCOUNTER — Other Ambulatory Visit: Payer: Self-pay | Admitting: Family Medicine

## 2023-09-22 ENCOUNTER — Encounter (HOSPITAL_BASED_OUTPATIENT_CLINIC_OR_DEPARTMENT_OTHER): Payer: Self-pay | Admitting: Pulmonary Disease

## 2023-11-24 DIAGNOSIS — R972 Elevated prostate specific antigen [PSA]: Secondary | ICD-10-CM | POA: Diagnosis not present

## 2023-11-30 DIAGNOSIS — R972 Elevated prostate specific antigen [PSA]: Secondary | ICD-10-CM | POA: Diagnosis not present

## 2023-11-30 DIAGNOSIS — N200 Calculus of kidney: Secondary | ICD-10-CM | POA: Diagnosis not present

## 2023-11-30 DIAGNOSIS — R351 Nocturia: Secondary | ICD-10-CM | POA: Diagnosis not present

## 2023-11-30 DIAGNOSIS — N401 Enlarged prostate with lower urinary tract symptoms: Secondary | ICD-10-CM | POA: Diagnosis not present

## 2024-01-26 DIAGNOSIS — H43393 Other vitreous opacities, bilateral: Secondary | ICD-10-CM | POA: Diagnosis not present

## 2024-02-01 DIAGNOSIS — L400 Psoriasis vulgaris: Secondary | ICD-10-CM | POA: Diagnosis not present

## 2024-02-20 ENCOUNTER — Other Ambulatory Visit: Payer: Self-pay

## 2024-02-20 ENCOUNTER — Ambulatory Visit
Admission: EM | Admit: 2024-02-20 | Discharge: 2024-02-20 | Disposition: A | Discharge status: Home / Self Care | Attending: Family Medicine | Admitting: Family Medicine

## 2024-02-20 ENCOUNTER — Encounter: Payer: Self-pay | Admitting: Emergency Medicine

## 2024-02-20 DIAGNOSIS — D869 Sarcoidosis, unspecified: Secondary | ICD-10-CM | POA: Diagnosis not present

## 2024-02-20 DIAGNOSIS — U071 COVID-19: Secondary | ICD-10-CM | POA: Diagnosis not present

## 2024-02-20 LAB — POC COVID19/FLU A&B COMBO
Covid Antigen, POC: POSITIVE — AB
Influenza A Antigen, POC: NEGATIVE
Influenza B Antigen, POC: NEGATIVE

## 2024-02-20 MED ORDER — PROMETHAZINE-DM 6.25-15 MG/5ML PO SYRP: 5.0000 mL | ORAL_SOLUTION | Freq: Four times a day (QID) | ORAL | 0 refills | Status: DC | PRN

## 2024-02-20 MED ORDER — PAXLOVID (300/100) 20 X 150 MG & 10 X 100MG PO TBPK: 3.0000 | ORAL_TABLET | Freq: Two times a day (BID) | ORAL | 0 refills | Status: AC

## 2024-02-20 MED ORDER — ALBUTEROL SULFATE HFA 108 (90 BASE) MCG/ACT IN AERS: 2.0000 | INHALATION_SPRAY | RESPIRATORY_TRACT | 0 refills | Status: AC | PRN

## 2024-02-20 MED ORDER — AZELASTINE HCL 0.1 % NA SOLN: 1.0000 | Freq: Two times a day (BID) | NASAL | 0 refills | Status: AC

## 2024-02-20 NOTE — ED Triage Notes (Addendum)
 Pt reports possible covid. Pt reports cough, generalized weakness, body aches that started earlier this week. Denies known fevers or exposures. Has tried robitussin cough medicine with minimal change in symptoms.

## 2024-02-21 NOTE — ED Provider Notes (Signed)
 [facility]    CSN: [id number] Arrival date & time: 02/20/24  1443      History   Chief Complaint Chief Complaint  Patient presents with   Cough    HPI William [name] is a 73 y.o. male.   Patient presenting today with almost a week of cough, weakness, body aches, congestion.  Denies fever, chills, chest pain, shortness of breath, abdominal pain, vomiting, diarrhea.  States he has a history of sarcoidosis, has an albuterol  inhaler at home but has not needed it since symptoms started.    Past Medical History:  Diagnosis Date   Allergy    occ    GERD (gastroesophageal reflux disease)    past hx    Hyperlipidemia    Sarcoidosis 08/2013   Diagnosed with bronchoscopy. Pulmonary Involvement only.     Patient Active Problem List   Diagnosis Date Noted   Upper respiratory infection with cough and congestion 12/17/2022   Osteoarthritis of right knee 07/14/2022   S/P laparoscopic appendectomy 04/06/2018   Restrictive lung disease 11/14/2016   Chronic seasonal allergic rhinitis 11/14/2016   Psoriasis 04/27/2014   Pulmonary sarcoidosis (HCC) 09/05/2013    Past Surgical History:  Procedure Laterality Date   APPENDECTOMY     CHOLECYSTECTOMY     COLONOSCOPY     LAPAROSCOPIC APPENDECTOMY N/A 04/06/2018   Procedure: APPENDECTOMY LAPAROSCOPIC;  Surgeon: Mavis Anes, MD;  Location: [facility];  Service: General;  Laterality: N/A;   POLYPECTOMY     UPPER GASTROINTESTINAL ENDOSCOPY     VIDEO BRONCHOSCOPY Bilateral 09/14/2013   Procedure: VIDEO BRONCHOSCOPY WITH FLUORO;  Surgeon: Francis CHRISTELLA Dresser, MD;  Location: [facility];  Service: Cardiopulmonary;  Laterality: Bilateral;       Home Medications    Prior to Admission medications   Medication Sig Start Date End Date Taking? Authorizing Provider  albuterol  (VENTOLIN  HFA) 108 (90 Base) MCG/ACT inhaler Inhale 2 puffs into the lungs every 4 (four) hours as needed. 02/20/24  Yes Stuart Vernell Norris, PA-C  azelastine   (ASTELIN ) 0.1 % nasal spray Place 1 spray into both nostrils 2 (two) times daily. Use in each nostril as directed 02/20/24  Yes Stuart Vernell Norris, PA-C  nirmatrelvir/ritonavir (PAXLOVID , 300/100,) 20 x 150 MG & 10 x 100MG  TBPK Take 3 tablets by mouth 2 (two) times daily for 5 days. Patient GFR is 60. Take nirmatrelvir (150 mg) two tablets twice daily for 5 days and ritonavir (100 mg) one tablet twice daily for 5 days. 02/20/24 02/25/24 Yes Stuart Vernell Norris, PA-C  promethazine -dextromethorphan (PROMETHAZINE -DM) 6.25-15 MG/5ML syrup Take 5 mLs by mouth 4 (four) times daily as needed. 02/20/24  Yes Stuart Vernell Norris, PA-C  acetaminophen  (TYLENOL ) 325 MG tablet Take 325 mg by mouth as needed for moderate pain.    [provider]  albuterol  (VENTOLIN  HFA) 108 (90 Base) MCG/ACT inhaler Inhale 2 puffs into the lungs every 6 (six) hours as needed for wheezing or shortness of breath. 12/17/22   Severa Rock CHRISTELLA, FNP  B Complex Vitamins (B COMPLEX 1 PO) Take by mouth.    [provider]  cholecalciferol (VITAMIN D ) 400 units TABS tablet Take 400 Units by mouth daily.    [provider]  Coenzyme Q10 (CO Q 10 PO) Take 100 mg by mouth daily.    [provider]  finasteride  (PROSCAR ) 5 MG tablet Take 1 tablet (5 mg total) by mouth daily. 03/11/23   Zollie Lowers, MD  fluticasone  (FLONASE ) 50 MCG/ACT nasal spray Place  2 sprays into both nostrils daily as needed for allergies or rhinitis. 09/08/23   Jude Harden GAILS, MD  meloxicam  (MOBIC ) 7.5 MG tablet Take 1 tablet (7.5 mg total) by mouth daily. 03/11/23   Zollie Lowers, MD  mometasone  (ELOCON ) 0.1 % cream APPLY  CREAM TO FACIAL OUTBREAKS OF PSORIASIS ONCE DAILY 09/14/23   Zollie Lowers, MD  Multiple Vitamin (MULTIVITAMIN) tablet Take 1 tablet by mouth daily.    [provider]  Omega-3 Fatty Acids (FISH OIL) 1000 MG CAPS Take by mouth.    [provider]    Family History Family History  Problem  Relation Age of Onset   Allergies Father    Heart disease Father    Lung disease Neg Hx    Rheumatologic disease Neg Hx    Colon cancer Neg Hx    Colon polyps Neg Hx    Esophageal cancer Neg Hx    Stomach cancer Neg Hx    Rectal cancer Neg Hx     Social History Social History   Tobacco Use   Smoking status: Never   Smokeless tobacco: Never  Vaping Use   Vaping status: Never Used  Substance Use Topics   Alcohol use: No   Drug use: No     Allergies   Patient has no known allergies.   Review of Systems Review of Systems PER HPI  Physical Exam Triage Vital Signs ED Triage Vitals  Encounter Vitals Group     BP 02/20/24 1515 135/63     Girls Systolic BP Percentile --      Girls Diastolic BP Percentile --      Boys Systolic BP Percentile --      Boys Diastolic BP Percentile --      Pulse Rate 02/20/24 1515 85     Resp 02/20/24 1515 20     Temp 02/20/24 1515 98.5 F (36.9 C)     Temp Source 02/20/24 1515 Oral     SpO2 02/20/24 1515 97 %     Weight --      Height --      Head Circumference --      Peak Flow --      Pain Score 02/20/24 1518 0     Pain Loc --      Pain Education --      Exclude from Growth Chart --    No data found.  Updated Vital Signs BP 135/63 (BP Location: Right Arm)   Pulse 85   Temp 98.5 F (36.9 C) (Oral)   Resp 20   SpO2 97%   Visual Acuity Right Eye Distance:   Left Eye Distance:   Bilateral Distance:    Right Eye Near:   Left Eye Near:    Bilateral Near:     Physical Exam Vitals and nursing note reviewed.  Constitutional:      Appearance: He is well-developed.  HENT:     Head: Atraumatic.     Right Ear: External ear normal.     Left Ear: External ear normal.     Nose: Rhinorrhea present.     Mouth/Throat:     Pharynx: Posterior oropharyngeal erythema present. No oropharyngeal exudate.  Eyes:     Conjunctiva/sclera: Conjunctivae normal.     Pupils: Pupils are equal, round, and reactive to light.  Cardiovascular:      Rate and Rhythm: Normal rate and regular rhythm.  Pulmonary:     Effort: Pulmonary effort is normal. No respiratory distress.  Breath sounds: Wheezing present. No rales.  Musculoskeletal:        General: Normal range of motion.     Cervical back: Normal range of motion and neck supple.  Lymphadenopathy:     Cervical: No cervical adenopathy.  Skin:    General: Skin is warm and dry.  Neurological:     Mental Status: He is alert and oriented to person, place, and time.  Psychiatric:        Behavior: Behavior normal.    UC Treatments / Results  Labs (all labs ordered are listed, but only abnormal results are displayed) Labs Reviewed  POC COVID19/FLU A&B COMBO - Abnormal; Notable for the following components:      Result Value   Covid Antigen, POC Positive (*)    All other components within normal limits   EKG   Radiology No results found.  Procedures Procedures (including critical care time)  Medications Ordered in UC Medications - No data to display  Initial Impression / Assessment and Plan / UC Course  I have reviewed the triage vital signs and the nursing notes.  Pertinent labs & imaging results that were available during my care of the patient were reviewed by me and considered in my medical decision making (see chart for details).     Positive for COVID-19.  Will treat with Paxlovid , Phenergan  DM, Astelin  nasal spray, albuterol  as needed and discussed supportive home care.  Return precautions reviewed.  Final Clinical Impressions(s) / UC Diagnoses   Final diagnoses:  COVID-19  Sarcoidosis   Discharge Instructions   None    ED Prescriptions     Medication Sig Dispense Auth. Provider   nirmatrelvir/ritonavir (PAXLOVID , 300/100,) 20 x 150 MG & 10 x 100MG  TBPK Take 3 tablets by mouth 2 (two) times daily for 5 days. Patient GFR is 60. Take nirmatrelvir (150 mg) two tablets twice daily for 5 days and ritonavir (100 mg) one tablet twice daily for 5 days.  30 tablet Stuart Vernell Norris, PA-C   promethazine -dextromethorphan (PROMETHAZINE -DM) 6.25-15 MG/5ML syrup Take 5 mLs by mouth 4 (four) times daily as needed. 100 mL Stuart Vernell Norris, PA-C   azelastine  (ASTELIN ) 0.1 % nasal spray Place 1 spray into both nostrils 2 (two) times daily. Use in each nostril as directed 30 mL Stuart Vernell Norris, PA-C   albuterol  (VENTOLIN  HFA) 108 (90 Base) MCG/ACT inhaler Inhale 2 puffs into the lungs every 4 (four) hours as needed. 18 g Stuart Vernell Norris, [place]      PDMP not reviewed this encounter.   Stuart Vernell Norris, PA-C 02/21/24 1301

## 2024-03-03 DIAGNOSIS — L400 Psoriasis vulgaris: Secondary | ICD-10-CM | POA: Diagnosis not present

## 2024-03-14 ENCOUNTER — Ambulatory Visit: Payer: HMO | Admitting: Family Medicine

## 2024-03-14 ENCOUNTER — Ambulatory Visit: Payer: Self-pay | Admitting: Family Medicine

## 2024-03-14 ENCOUNTER — Encounter: Payer: Self-pay | Admitting: Family Medicine

## 2024-03-14 VITALS — BP 111/66 | HR 55 | Temp 98.0°F | Ht 67.0 in | Wt 164.0 lb

## 2024-03-14 DIAGNOSIS — Z23 Encounter for immunization: Secondary | ICD-10-CM

## 2024-03-14 DIAGNOSIS — D860 Sarcoidosis of lung: Secondary | ICD-10-CM | POA: Diagnosis not present

## 2024-03-14 DIAGNOSIS — M1711 Unilateral primary osteoarthritis, right knee: Secondary | ICD-10-CM

## 2024-03-14 DIAGNOSIS — Z13220 Encounter for screening for lipoid disorders: Secondary | ICD-10-CM

## 2024-03-14 DIAGNOSIS — L409 Psoriasis, unspecified: Secondary | ICD-10-CM | POA: Diagnosis not present

## 2024-03-14 DIAGNOSIS — R5383 Other fatigue: Secondary | ICD-10-CM

## 2024-03-14 DIAGNOSIS — Z0001 Encounter for general adult medical examination with abnormal findings: Secondary | ICD-10-CM

## 2024-03-14 DIAGNOSIS — Z0000 Encounter for general adult medical examination without abnormal findings: Secondary | ICD-10-CM

## 2024-03-14 LAB — URINALYSIS
Bilirubin, UA: NEGATIVE
Glucose, UA: NEGATIVE
Ketones, UA: NEGATIVE
Leukocytes,UA: NEGATIVE
Nitrite, UA: NEGATIVE
Protein,UA: NEGATIVE
RBC, UA: NEGATIVE
Specific Gravity, UA: 1.01 (ref 1.005–1.030)
Urobilinogen, Ur: 0.2 mg/dL (ref 0.2–1.0)
pH, UA: 6 (ref 5.0–7.5)

## 2024-03-14 LAB — LIPID PANEL

## 2024-03-14 MED ORDER — CIPROFLOXACIN HCL 500 MG PO TABS: 500.0000 mg | ORAL_TABLET | Freq: Two times a day (BID) | ORAL | 0 refills | Status: AC

## 2024-03-14 NOTE — Progress Notes (Signed)
 Subjective:  Patient ID: William [name], male    DOB: 28-May-1951  Age: 73 y.o. MRN: [id number]  CC: Annual Exam   HPI  Discussed the use of AI scribe software for clinical note transcription with the patient, who gave verbal consent to proceed.  History of Present Illness William [name] is a 73 year old male who presents for an annual physical exam.  He had a mild case of COVID-19 over a month ago, characterized by a cough lasting about a week and no fever. He currently has no ongoing symptoms from the infection.  He recently experienced a significant outbreak of psoriasis, particularly on his arms and face, and consulted a dermatologist. Clobetasol cream was prescribed, which effectively cleared the affected areas. He is scheduled to see a rheumatologist for further evaluation.  He reports increased sleepiness, especially in the evenings, and often naps once or twice a day. 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  He uses Ventolin  inhalers and Astelin  for breathing issues, which are currently well-controlled. He did not require increased use of his inhaler during his recent COVID-19 infection. Hot weather can exacerbate his breathing issues, but he manages by staying hydrated.  He takes finasteride  daily for prostate health and reports good urine flow. He is scheduled for a follow-up in November.  He takes meloxicam  daily for knee pain, which is well-managed with this medication. He occasionally experiences gas, which he manages with over-the-counter medication.          03/14/2024    8:18 AM 03/11/2023    8:15 AM 01/22/2023    8:09 AM  Depression screen PHQ 2/9  Decreased Interest 0 0 0  Down, Depressed, Hopeless 0 0 0  PHQ - 2 Score 0 0 0  Altered sleeping 1    Tired, decreased energy 0    Change in appetite 0    Feeling bad or failure about yourself  0    Trouble concentrating 0    Moving slowly or fidgety/restless  0    Suicidal thoughts 0    PHQ-9 Score 1    Difficult doing work/chores Not difficult at all      History William [name] has a past medical history of Allergy, Arthritis, Cataract, GERD (gastroesophageal reflux disease), Hyperlipidemia, and Sarcoidosis (08/2013).   He has a past surgical history that includes Cholecystectomy; Video bronchoscopy (Bilateral, 09/14/2013); Colonoscopy; laparoscopic appendectomy (N/A, 04/06/2018); Appendectomy; Polypectomy; and Upper gastrointestinal endoscopy.   His family history includes Allergies in his father; Heart disease in his father.He reports that he has never smoked. He has never used smokeless tobacco. He reports that he does not drink alcohol and does not use drugs.    ROS Review of Systems  Constitutional:  Positive for fatigue (sleepy a lot). Negative for activity change and unexpected weight change.  HENT:  Negative for congestion, ear pain, hearing loss, postnasal drip and trouble swallowing.   Eyes:  Negative for pain and visual disturbance.  Respiratory:  Negative for cough, chest tightness and shortness of breath.   Cardiovascular:  Negative for chest pain, palpitations and leg swelling.  Gastrointestinal:  Negative for abdominal distention, abdominal pain, blood in stool, constipation, diarrhea, nausea and vomiting.  Endocrine: Negative for cold intolerance, heat intolerance and polydipsia.  Genitourinary:  Negative for difficulty urinating, dysuria, flank pain, frequency and urgency.  Musculoskeletal:  Negative for arthralgias and joint swelling.  Skin:  Negative for color change, rash and wound.  Neurological:  Negative for dizziness, syncope, speech difficulty, weakness, light-headedness, numbness and headaches.  Hematological:  Does not bruise/bleed easily.  Psychiatric/Behavioral:  Negative for confusion, decreased concentration, dysphoric mood and sleep disturbance. The patient is not nervous/anxious.     Objective:  BP 111/66   Pulse  (!) 55   Temp 98 F (36.7 C)   Ht 5' 7 (1.702 m)   Wt 164 lb (74.4 kg)   SpO2 98%   BMI 25.69 kg/m   BP Readings from Last 3 Encounters:  03/14/24 111/66  02/20/24 135/63  09/08/23 110/72    Wt Readings from Last 3 Encounters:  03/14/24 164 lb (74.4 kg)  09/08/23 164 lb 6.4 oz (74.6 kg)  03/11/23 164 lb 9.6 oz (74.7 kg)     Physical Exam Constitutional:      Appearance: He is well-developed.  HENT:     Head: Normocephalic and atraumatic.  Eyes:     Pupils: Pupils are equal, round, and reactive to light.  Neck:     Thyroid : No thyromegaly.     Trachea: No tracheal deviation.  Cardiovascular:     Rate and Rhythm: Normal rate and regular rhythm.     Heart sounds: Normal heart sounds. No murmur heard.    No friction rub. No gallop.  Pulmonary:     Breath sounds: Normal breath sounds. No wheezing or rales.  Abdominal:     General: Bowel sounds are normal. There is no distension.     Palpations: Abdomen is soft. There is no mass.     Tenderness: There is no abdominal tenderness.     Hernia: There is no hernia in the left inguinal area.  Genitourinary:    Penis: Normal.      Testes: Normal.  Musculoskeletal:        General: Normal range of motion.     Cervical back: Normal range of motion.  Lymphadenopathy:     Cervical: No cervical adenopathy.  Skin:    General: Skin is warm and dry.  Neurological:     Mental Status: He is alert and oriented to person, place, and time.      Assessment & Plan:  Well adult exam  Encounter for immunization -     Flu vaccine HIGH DOSE PF(Fluzone Trivalent)  Psoriasis -     CBC with Differential/Platelet -     CMP14+EGFR -     TSH -     VITAMIN D  25 Hydroxy (Vit-D Deficiency, Fractures) -     PSA, total and free -     Urinalysis  Pulmonary sarcoidosis (HCC) -     CBC with Differential/Platelet -     CMP14+EGFR -     TSH -     VITAMIN D  25 Hydroxy (Vit-D Deficiency, Fractures) -     PSA, total and free -      Urinalysis  Primary osteoarthritis of right knee -     CBC with Differential/Platelet -     CMP14+EGFR -     TSH -     VITAMIN D  25 Hydroxy (Vit-D Deficiency, Fractures) -     PSA, total and free -     Urinalysis  Fatigue, unspecified type -     CBC with Differential/Platelet -     CMP14+EGFR -     TSH -     VITAMIN D  25 Hydroxy (Vit-D Deficiency, Fractures) -     PSA, total and free -     Urinalysis  Lipid screening -  CBC with Differential/Platelet -     CMP14+EGFR -     TSH -     VITAMIN D  25 Hydroxy (Vit-D Deficiency, Fractures) -     PSA, total and free -     Urinalysis -     Lipid panel    Follows with Rheumatology for psoriasis Assessment & Plan Adult Wellness Visit   An annual physical examination was conducted, and his recent COVID-19 infection resolved without complications. General health appears stable. Order PSA blood test, cholesterol test, and urine specimen.  Fatigue   He reports increased sleepiness, particularly in the evenings. Differential diagnosis includes hypothyroidism and anemia, with no significant weight gain or severe symptoms. Order blood tests for anemia and thyroid  function. Discuss if fatigue becomes excessive.  Psoriasis   A recent outbreak is under control with clobetasol cream. Dermatologist and rheumatologist are involved in management. No visible lesions during examination. Discussed potential use of biologicals with rheumatologist, noting his effectiveness and potential to suppress the immune system. Continue clobetasol cream as needed and follow up with rheumatologist for further management.  Pain and swelling of right knee   Knee pain is managed with meloxicam , which effectively controls symptoms. Continue meloxicam  7.5 mg daily.  Benign prostatic hyperplasia without lower urinary tract symptoms   Managed with finasteride . No urinary symptoms reported. Follow-up with urologist is scheduled for November. Continue finasteride  5 mg  daily, monitor urinary symptoms, and follow up with urologist in November.  Sarcoidosis   Last follow-up with the pulmonologist was satisfactory. No current respiratory symptoms. Advised to monitor for any increase in shortness of breath as sarcoidosis can rebound. Schedule follow-up with pulmonologist and monitor for respiratory symptoms.  Cerumen impaction, right ear   Wax buildup in the right ear with no hearing impairment reported. Use Debrox for cerumen impaction in the right ear.       Follow-up: Return in about 1 year (around 03/14/2025), or if symptoms worsen or fail to improve, for [facility] physical.  Butler Der, M.D.

## 2024-03-15 LAB — CMP14+EGFR
ALT: 18 IU/L (ref 0–44)
AST: 23 IU/L (ref 0–40)
Albumin: 4.5 g/dL (ref 3.8–4.8)
Alkaline Phosphatase: 83 IU/L (ref 49–135)
BUN/Creatinine Ratio: 17 (ref 10–24)
BUN: 18 mg/dL (ref 8–27)
Bilirubin Total: 0.7 mg/dL (ref 0.0–1.2)
CO2: 22 mmol/L (ref 20–29)
Calcium: 9.5 mg/dL (ref 8.6–10.2)
Chloride: 101 mmol/L (ref 96–106)
Creatinine, Ser: 1.06 mg/dL (ref 0.76–1.27)
Globulin, Total: 2.8 g/dL (ref 1.5–4.5)
Glucose: 100 mg/dL — AB (ref 70–99)
Potassium: 4.3 mmol/L (ref 3.5–5.2)
Sodium: 141 mmol/L (ref 134–144)
Total Protein: 7.3 g/dL (ref 6.0–8.5)
eGFR: 75 mL/min/1.73 (ref >59)

## 2024-03-15 LAB — LIPID PANEL
Cholesterol, Total: 214 mg/dL — AB (ref 100–199)
HDL: 39 mg/dL — AB (ref >39)
LDL CALC COMMENT:: 5.5 ratio — AB (ref 0.0–5.0)
LDL Chol Calc (NIH): 152 mg/dL — AB (ref 0–99)
Triglycerides: 127 mg/dL (ref 0–149)
VLDL Cholesterol Cal: 23 mg/dL (ref 5–40)

## 2024-03-15 LAB — CBC WITH DIFFERENTIAL/PLATELET
Basophils Absolute: 0 x10E3/uL (ref 0.0–0.2)
Basos: 0 %
EOS (ABSOLUTE): 0.2 x10E3/uL (ref 0.0–0.4)
Eos: 3 %
Hematocrit: 47.3 % (ref 37.5–51.0)
Hemoglobin: 15.2 g/dL (ref 13.0–17.7)
Immature Grans (Abs): 0 x10E3/uL (ref 0.0–0.1)
Immature Granulocytes: 0 %
Lymphocytes Absolute: 0.6 x10E3/uL — ABNORMAL LOW (ref 0.7–3.1)
Lymphs: 14 %
MCH: 30.3 pg (ref 26.6–33.0)
MCHC: 32.1 g/dL (ref 31.5–35.7)
MCV: 94 fL (ref 79–97)
Monocytes Absolute: 0.4 x10E3/uL (ref 0.1–0.9)
Monocytes: 10 %
Neutrophils Absolute: 3.2 x10E3/uL (ref 1.4–7.0)
Neutrophils: 73 %
Platelets: 182 x10E3/uL (ref 150–450)
RBC: 5.01 x10E6/uL (ref 4.14–5.80)
RDW: 12.3 % (ref 11.6–15.4)
WBC: 4.4 x10E3/uL (ref 3.4–10.8)

## 2024-03-15 LAB — TSH: TSH: 2.9 u[IU]/mL (ref 0.450–4.500)

## 2024-03-15 LAB — PSA, TOTAL AND FREE
PSA, Free Pct: 13.7 %
PSA, Free: 1.44 ng/mL
Prostate Specific Ag, Serum: 10.5 ng/mL — AB (ref 0.0–4.0)

## 2024-03-15 LAB — VITAMIN D 25 HYDROXY (VIT D DEFICIENCY, FRACTURES): Vit D, 25-Hydroxy: 30.9 ng/mL (ref 30.0–100.0)

## 2024-04-15 ENCOUNTER — Encounter (HOSPITAL_COMMUNITY): Payer: Self-pay | Admitting: *Deleted

## 2024-04-15 ENCOUNTER — Other Ambulatory Visit: Payer: Self-pay

## 2024-04-15 ENCOUNTER — Emergency Department (HOSPITAL_COMMUNITY)
Admission: EM | Admit: 2024-04-15 | Discharge: 2024-04-15 | Disposition: A | Discharge status: Home / Self Care | Payer: Worker's Compensation | Attending: Emergency Medicine | Admitting: Emergency Medicine

## 2024-04-15 DIAGNOSIS — Y990 Civilian activity done for income or pay: Secondary | ICD-10-CM | POA: Diagnosis not present

## 2024-04-15 DIAGNOSIS — W231XXA Caught, crushed, jammed, or pinched between stationary objects, initial encounter: Secondary | ICD-10-CM | POA: Insufficient documentation

## 2024-04-15 DIAGNOSIS — S51812A Laceration without foreign body of left forearm, initial encounter: Secondary | ICD-10-CM | POA: Insufficient documentation

## 2024-04-15 DIAGNOSIS — S61412A Laceration without foreign body of left hand, initial encounter: Secondary | ICD-10-CM | POA: Insufficient documentation

## 2024-04-15 DIAGNOSIS — Z23 Encounter for immunization: Secondary | ICD-10-CM | POA: Insufficient documentation

## 2024-04-15 DIAGNOSIS — S51819A Laceration without foreign body of unspecified forearm, initial encounter: Secondary | ICD-10-CM

## 2024-04-15 DIAGNOSIS — S61419A Laceration without foreign body of unspecified hand, initial encounter: Secondary | ICD-10-CM

## 2024-04-15 DIAGNOSIS — S59912A Unspecified injury of left forearm, initial encounter: Secondary | ICD-10-CM | POA: Diagnosis present

## 2024-04-15 MED ORDER — ONDANSETRON 8 MG PO TBDP: 8.0000 mg | ORAL_TABLET | Freq: Once | ORAL | Status: DC

## 2024-04-15 MED ORDER — TETANUS-DIPHTH-ACELL PERTUSSIS 5-2-15.5 LF-MCG/0.5 IM SUSP
0.5000 mL | Freq: Once | INTRAMUSCULAR | Status: AC
  Administered 2024-04-15: 0.5 mL via INTRAMUSCULAR
  Filled 2024-04-15: qty 0.5

## 2024-04-15 NOTE — Discharge Instructions (Signed)
 You are seen in the emergency department for an injury to your left forearm.  You had multiple skin tears that will take some time to heal.  You can use some gentle soap and water.  Return if any signs of infection.

## 2024-04-15 NOTE — ED Triage Notes (Signed)
 Pt with skin tears to left hand left forearm after his arm got catch between a stretcher and the vehicle. Pt works for [employer]

## 2024-04-15 NOTE — ED Provider Notes (Signed)
 [place] EMERGENCY DEPARTMENT AT [facility] Provider Note   CSN: [id number] Arrival date & time: 04/15/24  1707     Patient presents with: Laceration   William [name] is a 73 y.o. male.  He is here for evaluation of injury to the left forearm.  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  Has multiple skin tears of the back of his left hand and on his left forearm.  He does not feel the arm is otherwise broken.  No foreign body sensation.  He thinks his tetanus is at least 5 to 10 years ago.  No other injuries or complaints.  Not on blood thinners.  {Add pertinent medical, surgical, social history, OB history to YEP:67052} The history is provided by the patient.  Laceration Location:  Shoulder/arm and hand Shoulder/arm laceration location:  L forearm and L hand Hand laceration location:  Dorsum of L hand Quality: avulsion   Pain details:    Severity:  Mild Relieved by:  None tried Tetanus status:  Out of date Associated symptoms: no fever, no focal weakness and no numbness        Prior to Admission medications   Medication Sig Start Date End Date Taking? Authorizing Provider  acetaminophen  (TYLENOL ) 325 MG tablet Take 325 mg by mouth as needed for moderate pain.    [provider]  albuterol  (VENTOLIN  HFA) 108 (90 Base) MCG/ACT inhaler Inhale 2 puffs into the lungs every 4 (four) hours as needed. 02/20/24   Stuart Vernell Norris, PA-C  azelastine  (ASTELIN ) 0.1 % nasal spray Place 1 spray into both nostrils 2 (two) times daily. Use in each nostril as directed 02/20/24   Stuart Vernell Norris, PA-C  B Complex Vitamins (B COMPLEX 1 PO) Take by mouth.    [provider]  cholecalciferol (VITAMIN D ) 400 units TABS tablet Take 400 Units by mouth daily.    [provider]  ciprofloxacin  (CIPRO ) 500 MG tablet Take 1 tablet (500 mg total) by mouth 2 (two) times daily. For prostate. Take all of these. 03/14/24    Zollie Lowers, MD  Coenzyme Q10 (CO Q 10 PO) Take 100 mg by mouth daily.    [provider]  finasteride  (PROSCAR ) 5 MG tablet Take 1 tablet (5 mg total) by mouth daily. 03/11/23   Zollie Lowers, MD  fluticasone  (FLONASE ) 50 MCG/ACT nasal spray Place 2 sprays into both nostrils daily as needed for allergies or rhinitis. 09/08/23   Jude Harden GAILS, MD  meloxicam  (MOBIC ) 7.5 MG tablet Take 1 tablet (7.5 mg total) by mouth daily. 03/11/23   Zollie Lowers, MD  mometasone  (ELOCON ) 0.1 % cream APPLY  CREAM TO FACIAL OUTBREAKS OF PSORIASIS ONCE DAILY 09/14/23   Zollie Lowers, MD  Multiple Vitamin (MULTIVITAMIN) tablet Take 1 tablet by mouth daily.    [provider]  Omega-3 Fatty Acids (FISH OIL) 1000 MG CAPS Take by mouth.    [provider]    Allergies: Patient has no known allergies.    Review of Systems  Constitutional:  Negative for fever.  Neurological:  Negative for focal weakness.    Updated Vital Signs BP (!) 149/75   Pulse 71   Temp 98.1 F (36.7 C) (Oral)   Resp 16   Ht 5' 7 (1.702 m)   Wt 74.8 kg   SpO2 98%   BMI 25.84 kg/m   Physical Exam Vitals and nursing note reviewed.  Constitutional:  Appearance: Normal appearance. He is well-developed.  HENT:     Head: Normocephalic and atraumatic.  Eyes:     Conjunctiva/sclera: Conjunctivae normal.  Pulmonary:     Effort: Pulmonary effort is normal.  Musculoskeletal:        General: Tenderness and signs of injury present.     Cervical back: Neck supple.     Comments: He has multiple superficial skin tears of his left forearm and dorsum of left hand.  He is distal neurovascularly intact.  No bony tenderness.  No deformity.  Skin:    General: Skin is warm and dry.  Neurological:     General: No focal deficit present.     Mental Status: He is alert.     GCS: GCS eye subscore is 4. GCS verbal subscore is 5. GCS motor subscore is 6.     (all labs ordered are listed, but only abnormal results  are displayed) Labs Reviewed - No data to display  EKG: None  Radiology: No results found.  {Document cardiac monitor, telemetry assessment procedure when appropriate:32947} Procedures   Medications Ordered in the ED  Tdap (ADACEL) injection 0.5 mL (has no administration in time range)      {Click here for ABCD2, HEART and other calculators REFRESH Note before signing:1}                              Medical Decision Making Risk Prescription drug management.   This patient complains of injury left forearm; this involves an extensive number of treatment Options and is a complaint that carries with it a high risk of complications and morbidity. The differential includes skin tear, foreign body, laceration, fracture  Previous records obtained and reviewed in epic no recent admissions. Social determinants considered, no significant barriers Critical Interventions: None  After the interventions stated above, I reevaluated the patient and found patient to be well-appearing in no distress Admission and further testing considered, his wounds were cleaned and dressed.  Skin was reapproximated to cover as much of the wound as we could.  No sutures were placed.   {Document critical care time when appropriate  Document review of labs and clinical decision tools ie CHADS2VASC2, etc  Document your independent review of radiology images and any outside records  Document your discussion with family members, caretakers and with consultants  Document social determinants of health affecting pt's care  Document your decision making why or why not admission, treatments were needed:32947:::1}   Final diagnoses:  None    ED Discharge Orders     None

## 2024-04-18 ENCOUNTER — Ambulatory Visit: Admitting: Family Medicine

## 2024-04-18 ENCOUNTER — Encounter: Payer: Self-pay | Admitting: Family Medicine

## 2024-04-18 VITALS — BP 110/63 | HR 55 | Temp 98.0°F | Ht 67.0 in | Wt 165.2 lb

## 2024-04-18 DIAGNOSIS — J0140 Acute pansinusitis, unspecified: Secondary | ICD-10-CM

## 2024-04-18 DIAGNOSIS — S5012XA Contusion of left forearm, initial encounter: Secondary | ICD-10-CM | POA: Diagnosis not present

## 2024-04-18 MED ORDER — AMOXICILLIN-POT CLAVULANATE 875-125 MG PO TABS: 1.0000 | ORAL_TABLET | Freq: Two times a day (BID) | ORAL | 0 refills | Status: AC

## 2024-04-18 NOTE — Progress Notes (Signed)
 Subjective:  Patient ID: William [name], male    DOB: Nov 30, 1950  Age: 73 y.o. MRN: [id number]  CC: Follow-up (Skin tear left hand/wrist)   HPI  Discussed the use of AI scribe software for clinical note transcription with the patient, who gave verbal consent to proceed.  History of Present Illness William [name] is a 73 year old male who presents with a left forearm and hand injury and sinus congestion.  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 He has been applying Vaseline to the injury and keeping it covered. He cleans the area with sterile saline solution and plans to switch to warm soapy water once the saline runs out.  He experiences sinus congestion that began over the weekend, approximately two to three days ago. There is drainage from his sinuses, particularly from the left cheek area, and occasional yellow discharge when blowing his nose. No tenderness or pain in the sinus areas is noted, but he has some hoarseness. He is concerned about the congestion potentially affecting his lungs, although he does not believe it has reached that point. He has not experienced any palpitations or heart issues in the past and is currently not taking any medications for this condition.  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          04/18/2024   10:03 AM 03/14/2024    8:18 AM 03/11/2023    8:15 AM  Depression screen PHQ 2/9  Decreased Interest 0 0 0  Down, Depressed, Hopeless 0 0 0  PHQ - 2 Score 0 0 0  Altered sleeping 1 1   Tired, decreased energy 0 0   Change in appetite 0 0   Feeling bad or failure about yourself  0 0   Trouble concentrating 0 0   Moving slowly or fidgety/restless 0 0   Suicidal thoughts 0 0   PHQ-9 Score 1 1   Difficult doing work/chores Not  difficult at all Not difficult at all     History William [name] has a past medical history of Allergy, Arthritis, Cataract, GERD (gastroesophageal reflux disease), Hyperlipidemia, and Sarcoidosis (08/2013).   He has a past surgical history that includes Cholecystectomy; Video bronchoscopy (Bilateral, 09/14/2013); Colonoscopy; laparoscopic appendectomy (N/A, 04/06/2018); Appendectomy; Polypectomy; and Upper gastrointestinal endoscopy.   His family history includes Allergies in his father; Heart disease in his father.He reports that he has never smoked. He has never used smokeless tobacco. He reports that he does not drink alcohol and does not use drugs.    ROS Review of Systems  Objective:  BP 110/63   Pulse (!) 55   Temp 98 F (36.7 C)   Ht 5' 7 (1.702 m)   Wt 165 lb 3.2 oz (74.9 kg)   SpO2 98%   BMI 25.87 kg/m   BP Readings from Last 3 Encounters:  04/18/24 110/63  04/15/24 103/76  03/14/24 111/66    Wt Readings from Last 3 Encounters:  04/18/24 165 lb 3.2 oz (74.9 kg)  04/15/24 165 lb (74.8 kg)  03/14/24 164 lb (74.4 kg)     Physical Exam Physical Exam GENERAL: Alert, cooperative, well developed, no acute distress. HEENT: Normocephalic, normal oropharynx, moist mucous membranes, no maxillary sinus tenderness, nasal passages swollen. CHEST: Clear to auscultation bilaterally, no  wheezes, rhonchi, or crackles. CARDIOVASCULAR: Normal heart rate and rhythm, S1 and S2 normal without murmurs. ABDOMEN: Soft, non-tender, non-distended, without organomegaly, normal bowel sounds. EXTREMITIES: No cyanosis or edema, bruising on left forearm and hand. NEUROLOGICAL: Cranial nerves grossly intact, moves all extremities without gross motor or sensory deficit.   Assessment & Plan:  Contusion of left forearm, initial encounter  Acute non-recurrent pansinusitis  Other orders -     Amoxicillin -Pot Clavulanate; Take 1 tablet by mouth 2 (two) times daily. Take all of this medication   Dispense: 20 tablet; Refill: 0    Assessment and Plan Assessment & Plan Left forearm and hand soft tissue injury with skin tear   He sustained a soft tissue injury with a skin tear on the left forearm and hand while lifting a heavy object at work three days ago. Bruising is present, but there is no swelling, numbness, or tingling. The skin tear is healing well with some areas where the skin was peeled back and reattached. Apply Neosporin to the affected area and cover the wound with a bandage. Clean the wound with warm soapy water or sterile saline if available. [place] should apply fresh gauze and Neosporin.  Acute sinusitis with upper respiratory congestion and hoarseness   He has acute sinusitis with congestion and hoarseness, likely due to an infection. Symptoms include sinus drainage, nasal congestion, and hoarseness. There is no tenderness in the frontal or maxillary sinuses. Symptoms began approximately two to three days ago. Yellow nasal discharge suggests a possible bacterial infection. No history of palpitations or heart issues with decongestants, making Mucinex a suitable option. Prescribe antibiotics for sinusitis and recommend Mucinex for decongestion.       Follow-up: No follow-ups on file.  Butler Der, M.D.

## 2024-04-21 ENCOUNTER — Encounter: Payer: Self-pay | Admitting: Family Medicine

## 2024-04-26 ENCOUNTER — Other Ambulatory Visit: Payer: Self-pay | Admitting: Family Medicine

## 2024-04-26 DIAGNOSIS — M25461 Effusion, right knee: Secondary | ICD-10-CM

## 2024-05-02 DIAGNOSIS — M2559 Pain in other specified joint: Secondary | ICD-10-CM | POA: Diagnosis not present

## 2024-05-02 DIAGNOSIS — Z6825 Body mass index (BMI) 25.0-25.9, adult: Secondary | ICD-10-CM | POA: Diagnosis not present

## 2024-05-02 DIAGNOSIS — M1991 Primary osteoarthritis, unspecified site: Secondary | ICD-10-CM | POA: Diagnosis not present

## 2024-05-02 DIAGNOSIS — R5383 Other fatigue: Secondary | ICD-10-CM | POA: Diagnosis not present

## 2024-05-02 DIAGNOSIS — E663 Overweight: Secondary | ICD-10-CM | POA: Diagnosis not present

## 2024-05-02 DIAGNOSIS — L409 Psoriasis, unspecified: Secondary | ICD-10-CM | POA: Diagnosis not present

## 2024-05-11 DIAGNOSIS — R972 Elevated prostate specific antigen [PSA]: Secondary | ICD-10-CM | POA: Diagnosis not present

## 2024-05-18 DIAGNOSIS — R3912 Poor urinary stream: Secondary | ICD-10-CM | POA: Diagnosis not present

## 2024-05-18 DIAGNOSIS — E663 Overweight: Secondary | ICD-10-CM | POA: Diagnosis not present

## 2024-05-18 DIAGNOSIS — L409 Psoriasis, unspecified: Secondary | ICD-10-CM | POA: Diagnosis not present

## 2024-05-18 DIAGNOSIS — N401 Enlarged prostate with lower urinary tract symptoms: Secondary | ICD-10-CM | POA: Diagnosis not present

## 2024-05-18 DIAGNOSIS — M2559 Pain in other specified joint: Secondary | ICD-10-CM | POA: Diagnosis not present

## 2024-05-18 DIAGNOSIS — R351 Nocturia: Secondary | ICD-10-CM | POA: Diagnosis not present

## 2024-05-18 DIAGNOSIS — M1991 Primary osteoarthritis, unspecified site: Secondary | ICD-10-CM | POA: Diagnosis not present

## 2024-05-18 DIAGNOSIS — Z6826 Body mass index (BMI) 26.0-26.9, adult: Secondary | ICD-10-CM | POA: Diagnosis not present

## 2024-05-18 DIAGNOSIS — R5383 Other fatigue: Secondary | ICD-10-CM | POA: Diagnosis not present

## 2024-05-18 DIAGNOSIS — R972 Elevated prostate specific antigen [PSA]: Secondary | ICD-10-CM | POA: Diagnosis not present

## 2024-08-08 ENCOUNTER — Ambulatory Visit: Admitting: Dermatology

## 2024-09-15 ENCOUNTER — Ambulatory Visit (HOSPITAL_BASED_OUTPATIENT_CLINIC_OR_DEPARTMENT_OTHER): Admitting: Pulmonary Disease

## 2025-03-15 ENCOUNTER — Encounter: Payer: Self-pay | Admitting: Family Medicine
